# Patient Record
Sex: Female | Born: 1975 | Race: White | Hispanic: No
Health system: Southern US, Community
[De-identification: ages and names within clinical notes are randomized; demographics above are authoritative.]

## PROBLEM LIST (undated history)

## (undated) ENCOUNTER — Emergency Department (HOSPITAL_COMMUNITY): Admission: EM | Payer: Self-pay | Source: Home / Self Care

## (undated) DIAGNOSIS — T7840XA Allergy, unspecified, initial encounter: Secondary | ICD-10-CM

## (undated) DIAGNOSIS — G5600 Carpal tunnel syndrome, unspecified upper limb: Secondary | ICD-10-CM

## (undated) HISTORY — PX: APPENDECTOMY: SHX54

## (undated) HISTORY — DX: Allergy, unspecified, initial encounter: T78.40XA

---

## 1999-06-26 ENCOUNTER — Other Ambulatory Visit: Admission: RE | Admit: 1999-06-26 | Discharge: 1999-06-26 | Payer: Self-pay | Admitting: Obstetrics and Gynecology

## 2000-04-22 ENCOUNTER — Ambulatory Visit (HOSPITAL_COMMUNITY): Admission: RE | Admit: 2000-04-22 | Discharge: 2000-04-22 | Payer: Self-pay | Admitting: Family Medicine

## 2000-04-22 ENCOUNTER — Encounter: Payer: Self-pay | Admitting: Family Medicine

## 2000-06-12 ENCOUNTER — Encounter: Payer: Self-pay | Admitting: Family Medicine

## 2000-06-12 ENCOUNTER — Ambulatory Visit (HOSPITAL_COMMUNITY): Admission: RE | Admit: 2000-06-12 | Discharge: 2000-06-12 | Payer: Self-pay | Admitting: Family Medicine

## 2000-10-02 ENCOUNTER — Ambulatory Visit (HOSPITAL_COMMUNITY): Admission: RE | Admit: 2000-10-02 | Discharge: 2000-10-02 | Payer: Self-pay | Admitting: Family Medicine

## 2000-10-02 ENCOUNTER — Encounter: Payer: Self-pay | Admitting: Family Medicine

## 2000-11-08 ENCOUNTER — Inpatient Hospital Stay (HOSPITAL_COMMUNITY): Admission: AD | Admit: 2000-11-08 | Discharge: 2000-11-13 | Payer: Self-pay | Admitting: Family Medicine

## 2000-11-08 ENCOUNTER — Encounter (INDEPENDENT_AMBULATORY_CARE_PROVIDER_SITE_OTHER): Payer: Self-pay | Admitting: Specialist

## 2000-11-16 ENCOUNTER — Encounter: Admission: RE | Admit: 2000-11-16 | Discharge: 2001-02-14 | Payer: Self-pay | Admitting: Family Medicine

## 2001-01-13 ENCOUNTER — Other Ambulatory Visit: Admission: RE | Admit: 2001-01-13 | Discharge: 2001-01-13 | Payer: Self-pay | Admitting: *Deleted

## 2002-02-22 ENCOUNTER — Other Ambulatory Visit: Admission: RE | Admit: 2002-02-22 | Discharge: 2002-02-22 | Payer: Self-pay | Admitting: Obstetrics and Gynecology

## 2003-03-08 ENCOUNTER — Other Ambulatory Visit: Admission: RE | Admit: 2003-03-08 | Discharge: 2003-03-08 | Payer: Self-pay | Admitting: Obstetrics and Gynecology

## 2005-11-28 ENCOUNTER — Emergency Department (HOSPITAL_COMMUNITY): Admission: EM | Admit: 2005-11-28 | Discharge: 2005-11-28 | Payer: Self-pay | Admitting: Emergency Medicine

## 2006-02-17 ENCOUNTER — Other Ambulatory Visit: Admission: RE | Admit: 2006-02-17 | Discharge: 2006-02-17 | Payer: Self-pay | Admitting: Obstetrics and Gynecology

## 2010-05-23 ENCOUNTER — Encounter: Admission: RE | Admit: 2010-05-23 | Discharge: 2010-05-23 | Payer: Self-pay | Admitting: Obstetrics and Gynecology

## 2010-06-13 ENCOUNTER — Emergency Department (HOSPITAL_COMMUNITY): Admission: EM | Admit: 2010-06-13 | Discharge: 2010-06-13 | Payer: Self-pay | Admitting: Emergency Medicine

## 2011-04-11 NOTE — Discharge Summary (Signed)
[facility]  Patient:    Suzanne Blair, Suzanne Blair                  MRN: [id number] Adm. Date:  [id number] Disc. Date: 11/13/00 Attending:  Orpah Greek                           Discharge Summary  DATE OF BIRTH:                [birth year]  ADMITTING DIAGNOSES:          1. Post dates pregnancy 40 5/7 weeks.                               2. Prodromal labor.                               3. Borderline blood pressure with possible                                  developing preeclampsia.  DISCHARGE DIAGNOSES:          1. Post dates pregnancy at 40 6/7 weeks,                                  delivered.                               2. Primary low transverse cesarean section for                                  arrest of first stage of labor.                               3. Postpartum endometritis.                               4. Iron deficiency anemia.  PROCEDURE:                    1. Primary low transverse cesarean section.                               2. Rubella immunization.  DISCHARGE SUMMARY:            A 24 YOA G1 P0 at 40 5/7 weeks presented in prodromal labor with contractions q.2-6 minutes.  Fetal heart rate 145-155 with good long-term variability, but not technically reactive.  Cervical examination at that time was a loose 1 cm, soft, vertex, -3 with a bulging bag.  Blood pressure was noted to be elevated at 137/78 and patients DTRs were increased at 2+.  She was admitted for induction of labor with low dose Pitocin and evaluated for preeclampsia.  Preeclampsia evaluation was completely negative.  Fetal heart tracing which had been nonreactive at the time of admission remained as such.  She underwent AROM and was found to have meconium  stained fluid.  She received amnio-infusion.  Increased fetal tachycardia was noted and she was started on clindamycin within six hours of admission for possible chorioamnionitis.  She progressed slowly  over the next six hours to 4 cm dilated.  She failed to progress past that with documented adequate labor, developed an intrapartum fever, and fetal tachycardia persisted.  She underwent a primary low transverse cesarean section early on the morning of November 09, 2000 with no complications.  Was delivered of a viable female infant 8 pounds 6 ounces.  Cord pH 7.21.  Apgars 9 at one and 9 at five minutes.                                Postpartum course was complicated by iron deficiency anemia with a hemoglobin of 8.8.  Patient was asymptomatic.  About 24 hours after discontinuation of antibiotics she developed worsening left lower quadrant pain.  She was started on clindamycin and gentamycin and 48 hours after that on the fourth postpartum day had minimal fundal tenderness bilaterally.  Abdomen was soft in general and she was ambulating and eating well.  She also was stooling normally.                                Patient was breast-feeding well at the time of discharge.  DISCHARGE MEDICATIONS:        1. Clindamycin 300 mg q.8. for four days.                               2. Prenatal vitamin q.d.                               3. Ferrous sulfate 325 mg p.o. q.d.                               4. Micronor one p.o. q.d. beginning mid January                                  2002.  FOLLOW-UP:                    Patient was advised to follow up in the office in six weeks, sooner p.r.n.  She did receive rubella immunization prior to discharge. DD:  11/13/00 TD:  11/13/00 Job: [id number]

## 2011-04-11 NOTE — Op Note (Signed)
[facility]  Patient:    Suzanne Blair, Suzanne Blair                  MRN: [id number] Proc. Date: 11/09/00 Adm. Date:  [id number] Attending:  Orpah Greek Dictator:   Montey Hora, M.D. CC:         [facility]   Operative Report  PREOPERATIVE DIAGNOSES:       1. Intrauterine pregnancy at 40 weeks and 6                                  days.                               2. Arrest of first stage of labor.                               3. Intrapartum fever.                               4. Large for gestational age fetus.                               5. Meconium stained fluid.  POSTOPERATIVE DIAGNOSES:      1. Intrauterine pregnancy at 40 weeks and 6                                  days.                               2. Arrest of first stage of labor.                               3. Intrapartum fever.                               4. Large for gestational age fetus.                               5. Meconium stained fluid.  OPERATION:                    Primary low transverse cesarean section.  SURGEON:                      Zenaida Niece, M.D.  ASSISTANT:                    Montey Hora, M.D.  ANESTHESIA:                   Epidural anesthesia.  FINDINGS:                     A viable female with Apgars of 9 at one and 9 at  five minutes.  Cord pH of 7.21.  Weight of 8                               pounds 6 ounces.  ESTIMATED BLOOD LOSS:         1000 cc.  URINE:                        300 cc of clear urine.  IV FLUIDS:                    1500 cc of lactated Ringers.  COMPLICATIONS:                None.  DESCRIPTION OF PROCEDURE:     The patient was taken to the operating room where the epidural anesthesia was found to be adequate. She was prepped and draped in a normal sterile fashion in a dorsal supine position with a leftward tilt. A Pfannenstiel skin incision was made with the  scalpel and carried through to the underlying layer of fascia with the Bovie and scalpel. Fascia was nicked in the midline and the incision extended laterally with Mayo scissors.  Superior aspect of the fascial incision was grasped with Kocher clamps, elevated and the underlying rectus muscles dissected off bluntly. The inferior aspect of the incision was, in a similar fashion, grasped, tented up with Kocher clamps and the rectus muscle dissected off bluntly.  The rectus muscles were separated in the midline. The peritoneum identified, tented up and entered sharply.  With Metzenbaum scissors the peritoneal incision was extended superiorly and inferiorly with good visualization of the bladder. The bladder blade was inserted and the vesicouterine peritoneum identified, grasped with pickups and entered sharply with Metzenbaum scissors.  The incision was extended laterally and the bladder flap created digitally. Bladder blade was reinserted and the lower uterine segment was incised in transverse fashion with the scalpel. The uterine incision was extended laterally with bandage scissors. Bladder blade was removed and the infants head was delivered atraumatically.  The nose and mouth were DeLee suctioned secondary to the meconium stained fluid. The cord was clamped and cut.  The infant was handed off to the awaiting pediatricians.  Cord gas was sent. The placenta was removed manually.  The uterus exteriorized and cleared of all clots and debris.  The uterine incision was repaired with 1-0 chromic in a running locked fashion.  The uterus was returned to the abdomen.  The gutters were cleared of all clots and debris and the fascia was reapproximated with 0 Vicryl in a running fashion.  The skin was closed  with staples. The patient tolerated the procedure well.  Sponge, lap and needle counts correct x 2. There were 2 g of Cefotan given at cord clamp and the patient was taken to the recovery room in  stable condition.DD:  11/11/00 TD:  11/12/00 Job: [id number]

## 2011-09-05 ENCOUNTER — Ambulatory Visit (HOSPITAL_COMMUNITY): Payer: Managed Care, Other (non HMO) | Admitting: Anesthesiology

## 2011-09-05 ENCOUNTER — Ambulatory Visit (HOSPITAL_COMMUNITY)
Admission: RE | Admit: 2011-09-05 | Discharge: 2011-09-05 | Disposition: A | Discharge status: Home / Self Care | Payer: Managed Care, Other (non HMO) | Source: Ambulatory Visit | Attending: Obstetrics and Gynecology | Admitting: Obstetrics and Gynecology

## 2011-09-05 ENCOUNTER — Encounter (HOSPITAL_COMMUNITY): Payer: Self-pay | Admitting: Anesthesiology

## 2011-09-05 ENCOUNTER — Encounter (HOSPITAL_COMMUNITY)
Admission: RE | Disposition: A | Discharge status: Home / Self Care | Payer: Self-pay | Source: Ambulatory Visit | Attending: Obstetrics and Gynecology

## 2011-09-05 ENCOUNTER — Encounter (HOSPITAL_COMMUNITY): Payer: Self-pay | Admitting: Obstetrics and Gynecology

## 2011-09-05 ENCOUNTER — Other Ambulatory Visit: Payer: Self-pay | Admitting: Obstetrics and Gynecology

## 2011-09-05 DIAGNOSIS — O2690 Pregnancy related conditions, unspecified, unspecified trimester: Secondary | ICD-10-CM | POA: Diagnosis present

## 2011-09-05 DIAGNOSIS — G5600 Carpal tunnel syndrome, unspecified upper limb: Secondary | ICD-10-CM | POA: Insufficient documentation

## 2011-09-05 DIAGNOSIS — O021 Missed abortion: Secondary | ICD-10-CM | POA: Insufficient documentation

## 2011-09-05 HISTORY — DX: Carpal tunnel syndrome, unspecified upper limb: G56.00

## 2011-09-05 HISTORY — PX: DILATION AND EVACUATION: SHX1459

## 2011-09-05 LAB — TYPE AND SCREEN
ABO/RH(D): O POS
Antibody Screen: NEGATIVE

## 2011-09-05 LAB — CBC
MCHC: 32.8 g/dL (ref 30.0–36.0)
Platelets: 262 10*3/uL (ref 150–400)

## 2011-09-05 SURGERY — DILATION AND EVACUATION, UTERUS
Anesthesia: Monitor Anesthesia Care | Site: Vagina | Wound class: Clean Contaminated

## 2011-09-05 MED ORDER — KETOROLAC TROMETHAMINE 60 MG/2ML IM SOLN
INTRAMUSCULAR | Status: AC
  Filled 2011-09-05: qty 2

## 2011-09-05 MED ORDER — MIDAZOLAM HCL 2 MG/2ML IJ SOLN
INTRAMUSCULAR | Status: AC
  Filled 2011-09-05: qty 2

## 2011-09-05 MED ORDER — PROPOFOL 10 MG/ML IV EMUL
INTRAVENOUS | Status: DC | PRN
  Administered 2011-09-05: 120 ug/kg/min via INTRAVENOUS

## 2011-09-05 MED ORDER — KETOROLAC TROMETHAMINE 30 MG/ML IJ SOLN
INTRAMUSCULAR | Status: DC | PRN
  Administered 2011-09-05: 60 mg via INTRAVENOUS

## 2011-09-05 MED ORDER — LIDOCAINE HCL 2 % IJ SOLN
INTRAMUSCULAR | Status: DC | PRN
  Administered 2011-09-05: 16 mL

## 2011-09-05 MED ORDER — DEXAMETHASONE SODIUM PHOSPHATE 10 MG/ML IJ SOLN
INTRAMUSCULAR | Status: AC
  Filled 2011-09-05: qty 1

## 2011-09-05 MED ORDER — ESMOLOL HCL 10 MG/ML IV SOLN
INTRAVENOUS | Status: AC
  Filled 2011-09-05: qty 10

## 2011-09-05 MED ORDER — FENTANYL CITRATE 0.05 MG/ML IJ SOLN: 25.0000 ug | INTRAMUSCULAR | Status: DC | PRN

## 2011-09-05 MED ORDER — ONDANSETRON HCL 4 MG/2ML IJ SOLN
INTRAMUSCULAR | Status: AC
  Filled 2011-09-05: qty 2

## 2011-09-05 MED ORDER — FENTANYL CITRATE 0.05 MG/ML IJ SOLN
INTRAMUSCULAR | Status: DC | PRN
  Administered 2011-09-05 (×2): 50 ug via INTRAVENOUS

## 2011-09-05 MED ORDER — ONDANSETRON HCL 4 MG/2ML IJ SOLN
INTRAMUSCULAR | Status: DC | PRN
  Administered 2011-09-05: 4 mg via INTRAVENOUS

## 2011-09-05 MED ORDER — FENTANYL CITRATE 0.05 MG/ML IJ SOLN
INTRAMUSCULAR | Status: AC
  Filled 2011-09-05: qty 5

## 2011-09-05 MED ORDER — PROPOFOL 10 MG/ML IV EMUL
INTRAVENOUS | Status: AC
  Filled 2011-09-05: qty 20

## 2011-09-05 MED ORDER — LIDOCAINE HCL (CARDIAC) 20 MG/ML IV SOLN
INTRAVENOUS | Status: DC | PRN
  Administered 2011-09-05: 60 mg via INTRAVENOUS

## 2011-09-05 MED ORDER — DOXYCYCLINE HYCLATE 100 MG IV SOLR
100.0000 mg | Freq: Once | INTRAVENOUS | Status: AC
  Administered 2011-09-05: 100 mg via INTRAVENOUS
  Filled 2011-09-05: qty 100

## 2011-09-05 MED ORDER — PROPOFOL 10 MG/ML IV EMUL
INTRAVENOUS | Status: DC | PRN
  Administered 2011-09-05: 30 mg via INTRAVENOUS

## 2011-09-05 MED ORDER — LIDOCAINE HCL (CARDIAC) 20 MG/ML IV SOLN
INTRAVENOUS | Status: AC
  Filled 2011-09-05: qty 5

## 2011-09-05 MED ORDER — LACTATED RINGERS IV SOLN
INTRAVENOUS | Status: DC
  Administered 2011-09-05 (×3): via INTRAVENOUS

## 2011-09-05 MED ORDER — MIDAZOLAM HCL 5 MG/5ML IJ SOLN
INTRAMUSCULAR | Status: DC | PRN
  Administered 2011-09-05: 2 mg via INTRAVENOUS

## 2011-09-05 SURGICAL SUPPLY — 22 items
CATH ROBINSON RED A/P 16FR (CATHETERS) ×2 IMPLANT
CLOTH BEACON ORANGE TIMEOUT ST (SAFETY) ×2 IMPLANT
DECANTER SPIKE VIAL GLASS SM (MISCELLANEOUS) ×2 IMPLANT
DRAPE UTILITY XL STRL (DRAPES) ×2 IMPLANT
GLOVE BIO SURGEON STRL SZ8 (GLOVE) ×2 IMPLANT
GLOVE ORTHO TXT STRL SZ7.5 (GLOVE) ×2 IMPLANT
GOWN PREVENTION PLUS LG XLONG (DISPOSABLE) ×2 IMPLANT
GOWN STRL REIN XL XLG (GOWN DISPOSABLE) ×2 IMPLANT
KIT BERKELEY 1ST TRIMESTER 3/8 (MISCELLANEOUS) ×2 IMPLANT
NDL SPNL 22GX3.5 QUINCKE BK (NEEDLE) ×1 IMPLANT
NEEDLE SPNL 22GX3.5 QUINCKE BK (NEEDLE) ×2 IMPLANT
NS IRRIG 1000ML POUR BTL (IV SOLUTION) ×2 IMPLANT
PACK VAGINAL MINOR WOMEN LF (CUSTOM PROCEDURE TRAY) ×2 IMPLANT
PAD PREP 24X48 CUFFED NSTRL (MISCELLANEOUS) ×2 IMPLANT
SET BERKELEY SUCTION TUBING (SUCTIONS) ×2 IMPLANT
SLEEVE SURGEON STRL (DRAPES) ×2 IMPLANT
SYR CONTROL 10ML LL (SYRINGE) ×2 IMPLANT
TOWEL OR 17X24 6PK STRL BLUE (TOWEL DISPOSABLE) ×4 IMPLANT
VACURETTE 10 RIGID CVD (CANNULA) IMPLANT
VACURETTE 7MM CVD STRL WRAP (CANNULA) ×1 IMPLANT
VACURETTE 8 RIGID CVD (CANNULA) IMPLANT
VACURETTE 9 RIGID CVD (CANNULA) IMPLANT

## 2011-09-05 NOTE — H&P (Addendum)
Suzanne Blair is an 35 y.o. female, G3 P2002, HCG 2400+, ultrasound with what appears to be a molar pregnancy.  She is having some bleeding.      Menstrual History: No LMP recorded. Patient is pregnant.    Past Medical History  Diagnosis Date  . Carpal tunnel syndrome     Past Surgical History  Procedure Date  . Appendectomy   C-section X 2  History reviewed. No pertinent family history.  Social History:  does not have a smoking history on file. She does not have any smokeless tobacco history on file. Her alcohol and drug histories not on file.  Allergies:  Allergies  Allergen Reactions  . Penicillins Hives and Rash    Childhood reaction    Prescriptions prior to admission  Medication Sig Dispense Refill  . ibuprofen (ADVIL,MOTRIN) 200 MG tablet Take 400 mg by mouth daily as needed. For pain       . prenatal vitamin w/FE, FA (PRENATAL 1 + 1) 27-1 MG TABS Take 1 tablet by mouth daily.          Review of Systems  Respiratory: Negative.   Cardiovascular: Negative.     Blood pressure 102/54, pulse 88, temperature 98.4 F (36.9 C), temperature source Oral, resp. rate 18, height 5' (1.524 m), weight 52.164 kg (115 lb), SpO2 100.00%. Physical Exam  Constitutional: She appears well-developed and well-nourished.  Neck: Neck supple. No thyromegaly present.  Cardiovascular: Normal rate, regular rhythm and normal heart sounds.   No murmur heard. Respiratory: Breath sounds normal. No respiratory distress.  GI: Soft.  Genitourinary: Vagina normal.    Results for orders placed during the hospital encounter of 09/05/11 (from the past 24 hour(s))  CBC     Status: Normal   Collection Time   09/05/11  8:16 AM      Component Value Range   WBC 7.3  4.0 - 10.5 (K/uL)   RBC 4.21  3.87 - 5.11 (MIL/uL)   Hemoglobin 12.4  12.0 - 15.0 (g/dL)   HCT 78.2  95.6 - 21.3 (%)   MCV 89.8  78.0 - 100.0 (fL)   MCH 29.5  26.0 - 34.0 (pg)   MCHC 32.8  30.0 - 36.0 (g/dL)   RDW 08.6  57.8  - 46.9 (%)   Platelets 262  150 - 400 (K/uL)    No results found.  Assessment/Plan: Possible molar pregnancy.  The situation has been discussed with the pt, options discussed.  D&E procedure and risks discussed, will proceed with D&E, will follow HCGs after procedure.  Suzzane Quilter D 09/05/2011, 8:59 AM

## 2011-09-05 NOTE — Op Note (Signed)
Preoperative Diagnosis:  Probable molar pregnancy Postop Diagnosis:  Possible molar pregnancy Procedure:  D&E Anesthesia:  MAC, paracervical block Findings: Cervix was closed, uterus was normal size, abundant products of conception were obtained Specimens: Products of conception sent for routine pathology Estimated blood loss: Minimal Complications: None  Procedure in detail: The patient was taken to the operating room and placed in the dorsosupine position. IV sedation was given and she was placed and mobile stirrups. Perineum and vagina were prepped and draped in the usual sterile fashion, bladder drained with a red Robinson catheter. A Graves speculum was inserted in the vagina. The anterior lip of the cervix was grasped with a single-tooth tenaculum. Paracervical block was then performed with a total of 16 cc of 2% plain lidocaine. Uterus then sounded to 9 cm. Cervix was gradually easily dilated to size 25 dilator. A size 7 curved suction curet was then inserted without difficulty. Suction curettage was return was performed with return of abundant products of conception. Sharp curettage was performed with which revealed good uterine cry in all quadrants and no significant tissue. Suction curettage was performed one more time which revealed minimal blood. The single-tooth tenaculum was removed from the cervix and bleeding was controlled with pressure. All instruments were removed from the vagina. The patient was taken to the recovery in stable condition after tolerating the procedure well. Counts were correct, she received doxycycline at the beginning of the procedure and had PAS hose on throughout the procedure.

## 2011-09-05 NOTE — Anesthesia Postprocedure Evaluation (Signed)
Anesthesia Post Note  Patient: Suzanne Blair  Procedure(s) Performed:  DILATATION AND EVACUATION (D&E)  Anesthesia type: MAC  Patient location: PACU  Post pain: Pain level controlled  Post assessment: Post-op Vital signs reviewed  Last Vitals:  Filed Vitals:   09/05/11 1045  BP: 102/64  Pulse: 73  Temp: 98.3 F (36.8 C)  Resp: 19    Post vital signs: Reviewed  Level of consciousness: sedated  Complications: No apparent anesthesia complications

## 2011-09-05 NOTE — Anesthesia Preprocedure Evaluation (Addendum)
Anesthesia Evaluation  Name, MR# and DOB Patient awake  General Assessment Comment  Reviewed: Allergy & Precautions, H&P , NPO status , Patient's Chart, lab work & pertinent test results, reviewed documented beta blocker date and time   History of Anesthesia Complications Negative for: history of anesthetic complications  Airway Mallampati: I TM Distance: >3 FB Neck ROM: full    Dental  (+) Teeth Intact   Pulmonary  clear to auscultation        Cardiovascular regular Normal    Neuro/Psych Negative Neurological ROS  Negative Psych ROS   GI/Hepatic negative GI ROS Neg liver ROS    Endo/Other  Negative Endocrine ROS  Renal/GU negative Renal ROS     Musculoskeletal   Abdominal   Peds  Hematology negative hematology ROS (+)   Anesthesia Other Findings   Reproductive/Obstetrics (+) Pregnancy (molar pregnancy, 10 weeks)                           Anesthesia Physical Anesthesia Plan  ASA: II  Anesthesia Plan: MAC   Post-op Pain Management:    Induction:   Airway Management Planned:   Additional Equipment:   Intra-op Plan:   Post-operative Plan:   Informed Consent: I have reviewed the patients History and Physical, chart, labs and discussed the procedure including the risks, benefits and alternatives for the proposed anesthesia with the patient or authorized representative who has indicated his/her understanding and acceptance.   Dental Advisory Given  Plan Discussed with: CRNA and Surgeon  Anesthesia Plan Comments:         Anesthesia Quick Evaluation

## 2011-09-05 NOTE — Preoperative (Signed)
Beta Blockers   Reason not to administer Beta Blockers:Not Applicable 

## 2011-09-05 NOTE — Transfer of Care (Signed)
Immediate Anesthesia Transfer of Care Note  Patient: Suzanne Blair  Procedure(s) Performed:  DILATATION AND EVACUATION (D&E)  Patient Location: PACU  Anesthesia Type: MAC  Level of Consciousness: awake, oriented and patient cooperative  Airway & Oxygen Therapy: Patient Spontanous Breathing and Patient connected to nasal cannula oxygen  Post-op Assessment: Report given to PACU RN and Post -op Vital signs reviewed and stable  Post vital signs: Reviewed and stable  Complications: No apparent anesthesia complications

## 2011-09-05 NOTE — Progress Notes (Signed)
   History reviewed, patient examined, no change in status, stable for surgery.  

## 2011-09-07 ENCOUNTER — Encounter (HOSPITAL_COMMUNITY): Payer: Self-pay | Admitting: Obstetrics and Gynecology

## 2014-03-01 ENCOUNTER — Other Ambulatory Visit: Payer: Self-pay | Admitting: Otolaryngology

## 2014-03-01 DIAGNOSIS — K1120 Sialoadenitis, unspecified: Secondary | ICD-10-CM

## 2014-03-03 ENCOUNTER — Ambulatory Visit
Admission: RE | Admit: 2014-03-03 | Discharge: 2014-03-03 | Disposition: A | Discharge status: Home / Self Care | Payer: BC Managed Care – PPO | Source: Ambulatory Visit | Attending: Otolaryngology | Admitting: Otolaryngology

## 2014-03-03 DIAGNOSIS — K1120 Sialoadenitis, unspecified: Secondary | ICD-10-CM

## 2014-03-03 MED ORDER — IOHEXOL 300 MG/ML  SOLN
75.0000 mL | Freq: Once | INTRAMUSCULAR | Status: AC | PRN
  Administered 2014-03-03: 75 mL via INTRAVENOUS

## 2014-03-08 ENCOUNTER — Other Ambulatory Visit (HOSPITAL_COMMUNITY): Payer: Self-pay | Admitting: Otolaryngology

## 2014-03-08 DIAGNOSIS — K118 Other diseases of salivary glands: Secondary | ICD-10-CM

## 2014-03-09 ENCOUNTER — Other Ambulatory Visit: Payer: Self-pay | Admitting: Radiology

## 2014-03-13 ENCOUNTER — Other Ambulatory Visit: Payer: Self-pay | Admitting: Radiology

## 2014-03-14 ENCOUNTER — Other Ambulatory Visit: Payer: Self-pay | Admitting: Radiology

## 2014-03-15 ENCOUNTER — Ambulatory Visit (HOSPITAL_COMMUNITY): Payer: BC Managed Care – PPO

## 2014-03-15 ENCOUNTER — Ambulatory Visit (HOSPITAL_COMMUNITY)
Admission: RE | Admit: 2014-03-15 | Discharge: 2014-03-15 | Disposition: A | Discharge status: Home / Self Care | Payer: BC Managed Care – PPO | Source: Ambulatory Visit | Attending: Otolaryngology | Admitting: Otolaryngology

## 2014-03-15 ENCOUNTER — Encounter (HOSPITAL_COMMUNITY): Payer: Self-pay

## 2014-03-15 DIAGNOSIS — K118 Other diseases of salivary glands: Secondary | ICD-10-CM

## 2014-03-15 DIAGNOSIS — K1120 Sialoadenitis, unspecified: Secondary | ICD-10-CM | POA: Insufficient documentation

## 2014-03-15 LAB — CBC
HCT: 38.7 % (ref 36.0–46.0)
Hemoglobin: 12.9 g/dL (ref 12.0–15.0)
MCH: 30.1 pg (ref 26.0–34.0)
MCHC: 33.3 g/dL (ref 30.0–36.0)
MCV: 90.4 fL (ref 78.0–100.0)
Platelets: 271 10*3/uL (ref 150–400)
RBC: 4.28 MIL/uL (ref 3.87–5.11)
RDW: 12.6 % (ref 11.5–15.5)
WBC: 5.1 10*3/uL (ref 4.0–10.5)

## 2014-03-15 LAB — PROTIME-INR
INR: 0.99 (ref 0.00–1.49)
PROTHROMBIN TIME: 12.9 s (ref 11.6–15.2)

## 2014-03-15 LAB — APTT: APTT: 30 s (ref 24–37)

## 2014-03-15 LAB — HCG, SERUM, QUALITATIVE: PREG SERUM: NEGATIVE

## 2014-03-15 MED ORDER — MIDAZOLAM HCL 2 MG/2ML IJ SOLN
INTRAMUSCULAR | Status: AC | PRN
  Administered 2014-03-15 (×2): 1 mg via INTRAVENOUS

## 2014-03-15 MED ORDER — MIDAZOLAM HCL 2 MG/2ML IJ SOLN
INTRAMUSCULAR | Status: AC
  Filled 2014-03-15: qty 4

## 2014-03-15 MED ORDER — FENTANYL CITRATE 0.05 MG/ML IJ SOLN
INTRAMUSCULAR | Status: AC
  Filled 2014-03-15: qty 4

## 2014-03-15 MED ORDER — SODIUM CHLORIDE 0.9 % IV SOLN: INTRAVENOUS | Status: DC

## 2014-03-15 MED ORDER — FENTANYL CITRATE 0.05 MG/ML IJ SOLN
INTRAMUSCULAR | Status: AC | PRN
  Administered 2014-03-15: 25 ug via INTRAVENOUS

## 2014-03-15 NOTE — H&P (Signed)
Suzanne Blair is an 38 y.o. female.   Chief Complaint: Pt has had symptoms of Left salivary gland swelling off and on x 2 yrs Has always been able to press on gland and release saliva  Noted 3 months ago new "mass" separate from salivary gland Mass has not changed in size; NT CT 03/03/14 revealed abnormal parotid gland  MD has requested biopsy of same  HPI: nonsmoker  Past Medical History  Diagnosis Date  . Carpal tunnel syndrome     Past Surgical History  Procedure Laterality Date  . Appendectomy    . Cesarean section  x2  . Dilation and evacuation  09/05/2011    Procedure: DILATATION AND EVACUATION (D&E);  Surgeon: Clarene Duke, MD;  Location: [facility];  Service: Gynecology;  Laterality: N/A;    No family history on file. Social History:  reports that she has never smoked. She does not have any smokeless tobacco history on file. She reports that she does not drink alcohol or use illicit drugs.  Allergies:  Allergies  Allergen Reactions  . Penicillins Hives and Rash    Childhood reaction     (Not in a hospital admission)  No results found for this or any previous visit (from the past 48 hour(s)). No results found.  Review of Systems  Constitutional: Negative for fever and weight loss.  Respiratory: Negative for shortness of breath.   Cardiovascular: Negative for chest pain.  Gastrointestinal: Negative for nausea, vomiting and abdominal pain.  Musculoskeletal: Negative for neck pain.  Neurological: Negative for weakness and headaches.  Psychiatric/Behavioral: Negative for substance abuse.    Blood pressure 99/60, pulse 94, temperature 98.5 F (36.9 C), resp. rate 20, height 5' (1.524 m), weight 50.803 kg (112 lb), last menstrual period 02/24/2014, SpO2 100.00%, unknown if currently breastfeeding. Physical Exam  Constitutional: She is oriented to person, place, and time. She appears well-developed and well-nourished.  Neck:  Palpable mass left parotid; NT   Cardiovascular: Normal rate, regular rhythm and normal heart sounds.   No murmur heard. Respiratory: Effort normal and breath sounds normal. She has no wheezes.  GI: Soft. Bowel sounds are normal. There is no tenderness.  Musculoskeletal: Normal range of motion.  Neurological: She is alert and oriented to person, place, and time.  Skin: Skin is warm and dry.  Psychiatric: She has a normal mood and affect. Her behavior is normal. Judgment and thought content normal.     Assessment/Plan Left parotid gland mass x 3 mo Abnormal CT Scheduled for biopsy Pt aware of procedure benefits and risks and agreeable to proceed Consent signed and in chart  Lavonia Drafts 03/15/2014, 9:34 AM

## 2014-03-15 NOTE — Discharge Instructions (Signed)
Bone Biopsy, Needle, Care After Read the instructions outlined below and refer to this sheet in the next few weeks. These discharge instructions provide you with general information on caring for yourself after you leave the hospital. Your caregiver may also give you specific instructions. While your treatment has been planned according to the most current medical practices available, unavoidable complications sometimes occur. If you have any problems or questions after discharge, call your caregiver. Finding out the results of your test Not all test results are available during your visit. If your test results are not back during the visit, make an appointment with your caregiver to find out the results. Do not assume everything is normal if you have not heard from your caregiver or the medical facility. It is important for you to follow up on all of your test results.  SEEK MEDICAL CARE IF:   You have redness, swelling, or increasing pain at the site of the biopsy.  You have pus coming from the biopsy site.  You have drainage from the biopsy site lasting longer than 1 day.  You notice a bad smell coming from the biopsy site or dressing.  You develop persistent nausea or vomiting. SEEK IMMEDIATE MEDICAL CARE IF:  You have a fever.  You develop a rash.  You have difficulty breathing.  You develop any reaction or side effects to medicines given. Document Released: 05/30/2005 Document Revised: 08/31/2013 Document Reviewed: 04/17/2009 [facility] Patient Information 2014 [place], [place].

## 2014-03-15 NOTE — Procedures (Signed)
Technically successful US guided FNA of L parotid gland nodule. Samples sent to lab. No immediate post procedural complications.

## 2014-03-21 ENCOUNTER — Other Ambulatory Visit: Payer: Self-pay | Admitting: Otolaryngology

## 2014-03-21 DIAGNOSIS — K118 Other diseases of salivary glands: Secondary | ICD-10-CM

## 2014-03-27 ENCOUNTER — Ambulatory Visit
Admission: RE | Admit: 2014-03-27 | Discharge: 2014-03-27 | Disposition: A | Discharge status: Home / Self Care | Payer: BC Managed Care – PPO | Source: Ambulatory Visit | Attending: Otolaryngology | Admitting: Otolaryngology

## 2014-03-27 DIAGNOSIS — K118 Other diseases of salivary glands: Secondary | ICD-10-CM

## 2014-03-27 MED ORDER — GADOBENATE DIMEGLUMINE 529 MG/ML IV SOLN
10.0000 mL | Freq: Once | INTRAVENOUS | Status: AC | PRN
  Administered 2014-03-27: 10 mL via INTRAVENOUS

## 2014-09-01 ENCOUNTER — Encounter: Payer: Self-pay | Admitting: *Deleted

## 2014-09-25 ENCOUNTER — Encounter: Payer: Self-pay | Admitting: *Deleted

## 2014-09-27 ENCOUNTER — Other Ambulatory Visit: Payer: Self-pay | Admitting: Otolaryngology

## 2014-09-27 DIAGNOSIS — K118 Other diseases of salivary glands: Secondary | ICD-10-CM

## 2014-10-12 ENCOUNTER — Other Ambulatory Visit: Payer: Self-pay | Admitting: Otolaryngology

## 2014-10-12 ENCOUNTER — Ambulatory Visit
Admission: RE | Admit: 2014-10-12 | Discharge: 2014-10-12 | Disposition: A | Discharge status: Home / Self Care | Payer: BC Managed Care – PPO | Source: Ambulatory Visit | Attending: Family Medicine | Admitting: Family Medicine

## 2014-10-12 ENCOUNTER — Other Ambulatory Visit: Payer: Self-pay | Admitting: Family Medicine

## 2014-10-12 ENCOUNTER — Inpatient Hospital Stay: Admission: RE | Admit: 2014-10-12 | Payer: Self-pay | Source: Ambulatory Visit

## 2014-10-12 DIAGNOSIS — K118 Other diseases of salivary glands: Secondary | ICD-10-CM

## 2015-01-11 IMAGING — CT CT NECK W/ CM
4 of 6 series · 14 of 33 positions shown, 16 images · IV contrast (75CC OMNI 300)
Comparison: Head CT without contrast 11/28/2005.

CLINICAL DATA: 37-year-old female with left parotid swelling.
Possible sialoadenitis. Symptoms for several years. Initial
encounter.

EXAM:
CT NECK WITH CONTRAST
TECHNIQUE: Multidetector CT imaging of the neck was performed using the
standard protocol following the bolus administration of intravenous
contrast.
CONTRAST:  75mL OMNIPAQUE IOHEXOL 300 MG/ML  SOLN

[Series 3: axial neck · axial · 0.45mm/px · z∈[-6,+64]mm · 2 of 86 slices shown]
[im 29/86  bone]
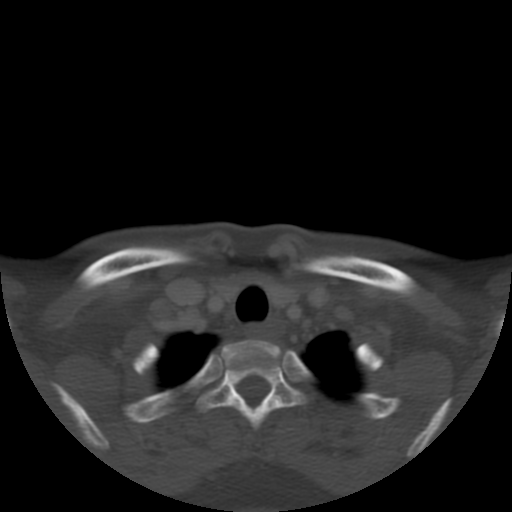
[im 57/86  bone]
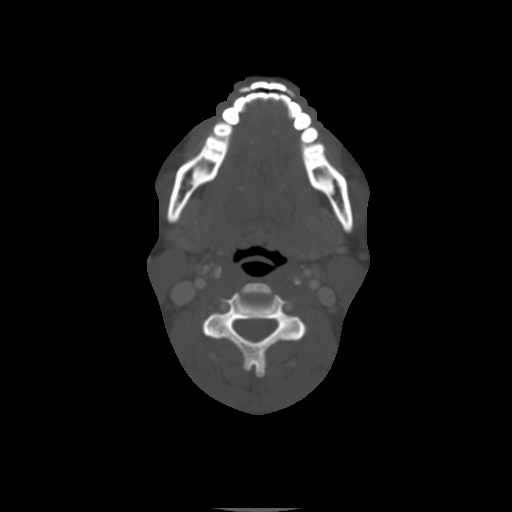

[Series 200: cor · coronal · 0.45mm/px · 3 of 96 slices shown]
[im 23/96  bone]
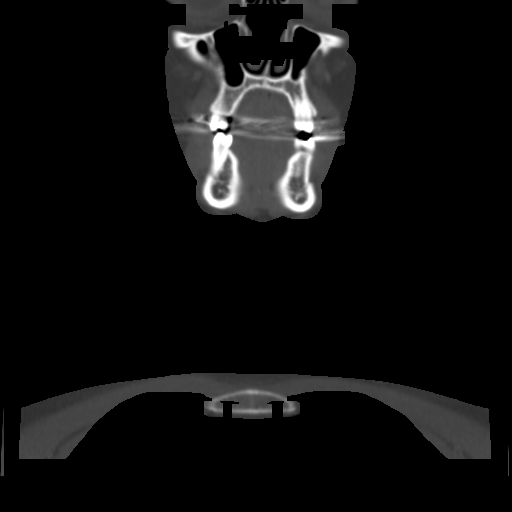
[im 40/96  bone]
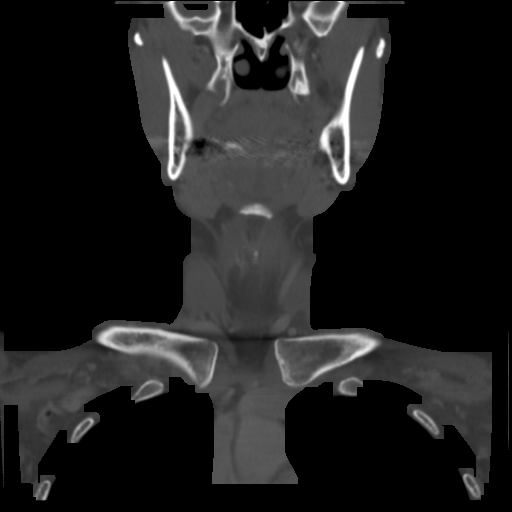
[im 56/96  bone]
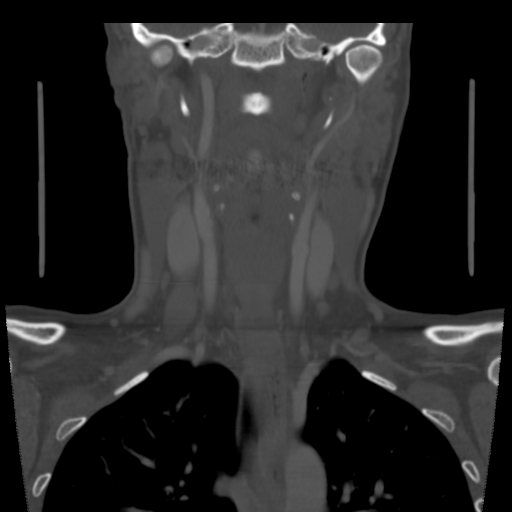

[Series 201: axial · axial · 0.45mm/px · z∈[-79,+60]mm · 4 of 124 slices shown, 5 images]
[im 25/124  soft-tissue]
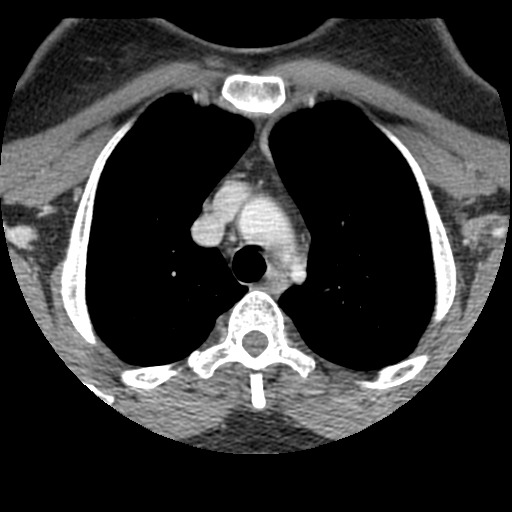
[im 25/124  bone]
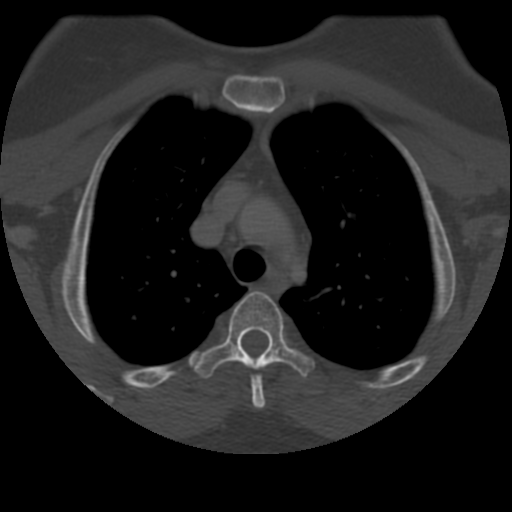
[im 50/124  bone]
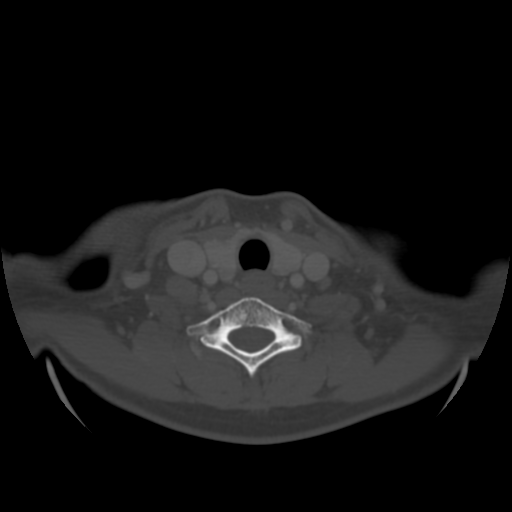
[im 74/124  bone]
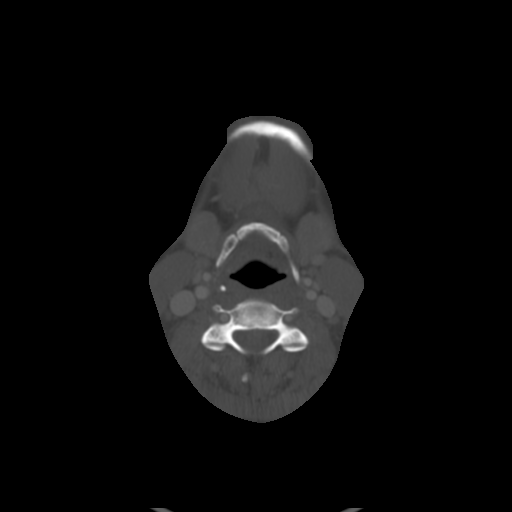
[im 99/124  bone]
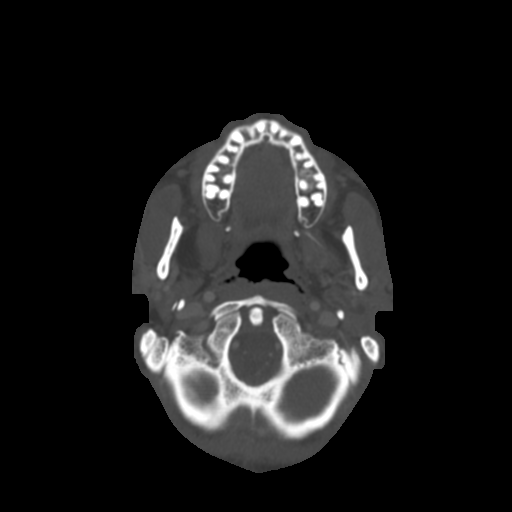

[Series 203: sag · sagittal · 0.45mm/px · 5 of 71 slices shown, 6 images]
[im 24/71  bone]
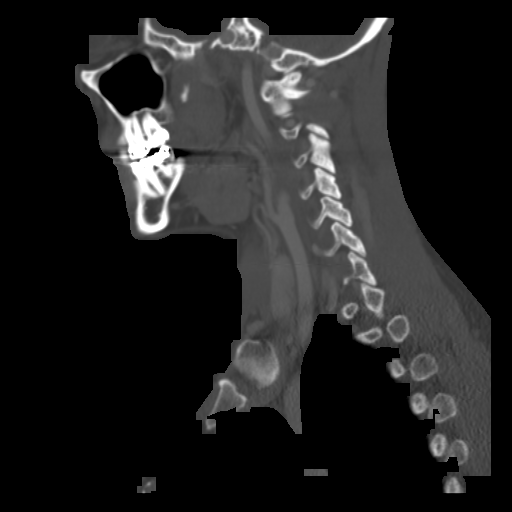
[im 30/71  bone]
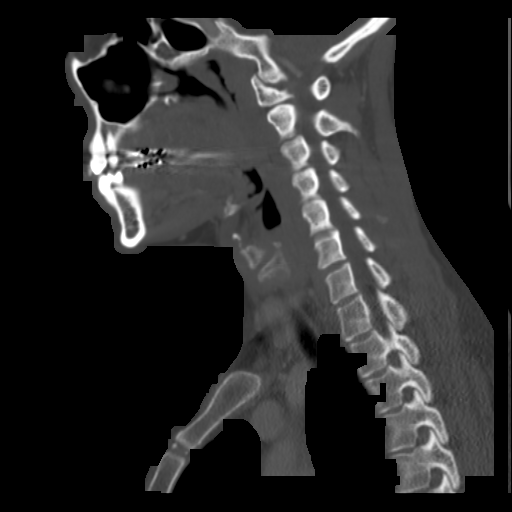
[im 36/71  soft-tissue]
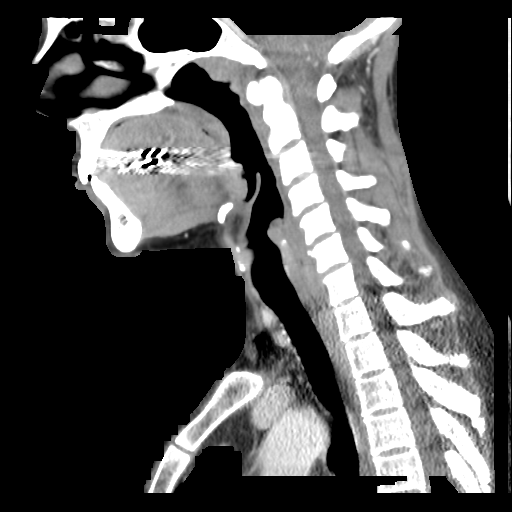
[im 36/71  bone]
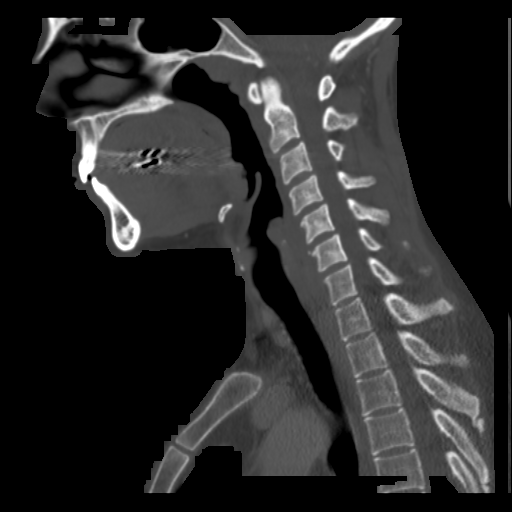
[im 41/71  bone]
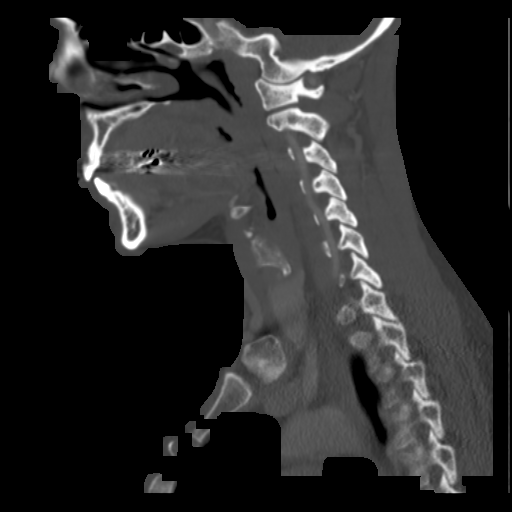
[im 47/71  bone]
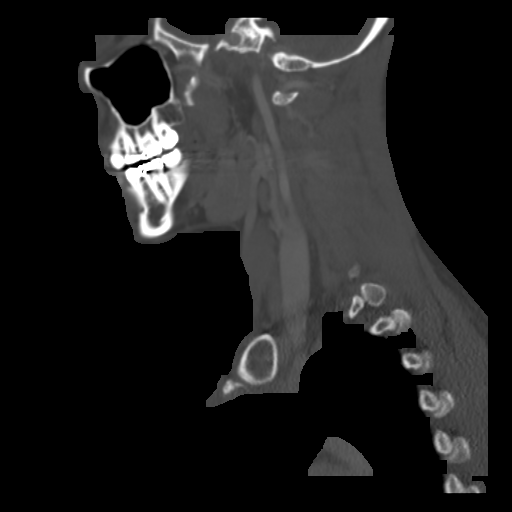

[14 of 33 positions shown; findings below may reference images not displayed]

FINDINGS: Negative lung apices. No superior mediastinal lymphadenopathy.
Negative visible osseous structures in the thorax.

Negative thyroid, pharynx, parapharyngeal spaces, retropharyngeal
space and sublingual space. Mild asymmetry of the larynx, within
normal limits. Negative submandibular glands. Major vascular
structures in the neck and at the skullbase are patent. Negative
visible brain parenchyma and orbits soft tissues.

Visualized paranasal sinuses and mastoids are clear. No acute
osseous abnormality identified.

Palpable area of concern marked at the left parotid space on series
3, image 17. The left parotid gland is heterogeneous, with scattered
hypodense nodules, and asymmetric enlargement of both the
superficial and deep lobes when compared to the smaller and more
homogeneous right parotid. See series 3, image 14 which demonstrates
a lobulated appearance of the parotid which trans axially
encompasses 33 x 35 mm. There is a small 2-3 mm calcification of the
gland in the deep lobe near the parapharyngeal space (same image).
No other parenchymal calcifications identified. The left parotid
duct is nondilated.

There is significant asymmetric soft tissue at the left stylomastoid
foramen (series 3, image 9, compare to the opposite side on the same
image). No skullbase erosion.

No cervical lymphadenopathy. Level 2 nodes measure up to 8 mm short
axis bilaterally.
IMPRESSION: 1. Lobulated and heterogeneous left parotid gland. Superficial and
deep lobe involvement. Asymmetric soft tissue at the left
stylomastoid foramen. Appearance is suspicious for a primary parotid
neoplasm, and furthermore there is evidence of abnormal soft tissue
along the course of the left seventh nerve at the left stylomastoid
foramen. Query left facial weakness.
2. Followup MRI (either face MRI without and with contrast or IAC
protocol Brain MRI without and with contrast) would further
characterize the left parotid gland an the left seventh nerve.
3. No lymphadenopathy.  Other salivary glands appear normal.
Salient findings discussed by telephone with Nurse Lesya Aujla for

## 2015-01-23 IMAGING — US US BIOPSY
1 series · 13 of 15 positions shown · non-contrast
Comparison: Neck CT - 03/03/2014

MEDICATIONS:
None

COMPLICATIONS:
None immediate

INDICATION: History of left-sided parotid gland swelling. Abnormal appearance of
the left parotid gland on recently performed neck CT. Request made
to perform ultrasound-guided fine-needle aspiration of the hypo
attenuating areas within the left parotid gland as was discussed
with the ordering physician.

EXAM:
ULTRASOUND GUIDED FINE NEEDLE ASPIRATION OF INDETERMINATE LEFT
PAROTID GLAND LESION
TECHNIQUE: Informed written consent was obtained from the patient after a
discussion of the risks, benefits and alternatives to treatment.
Questions regarding the procedure were encouraged and answered. A
timeout was performed prior to the initiation of the procedure.

[Series 1: us biopsy · 0.04mm/px · 15 acquisitions, 13 frames shown]
[im 1/15]
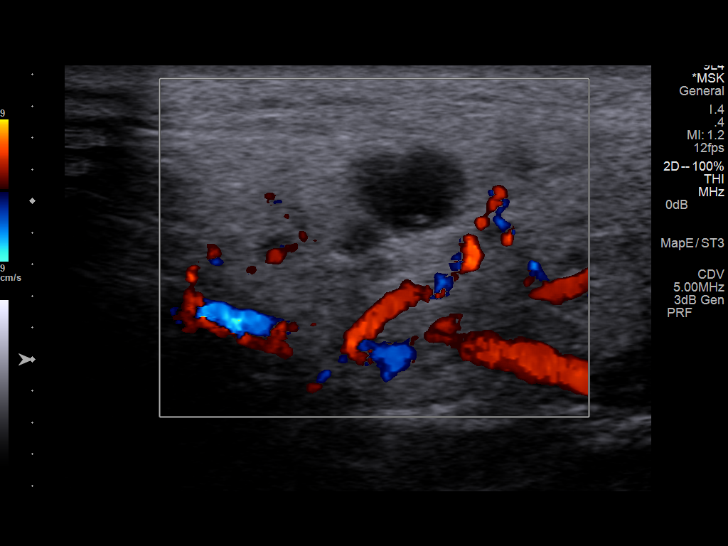
[im 2/15]
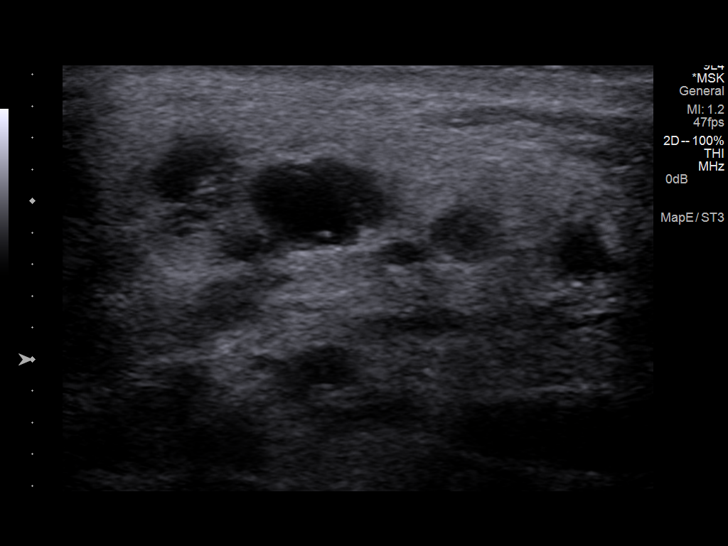
[im 3/15]
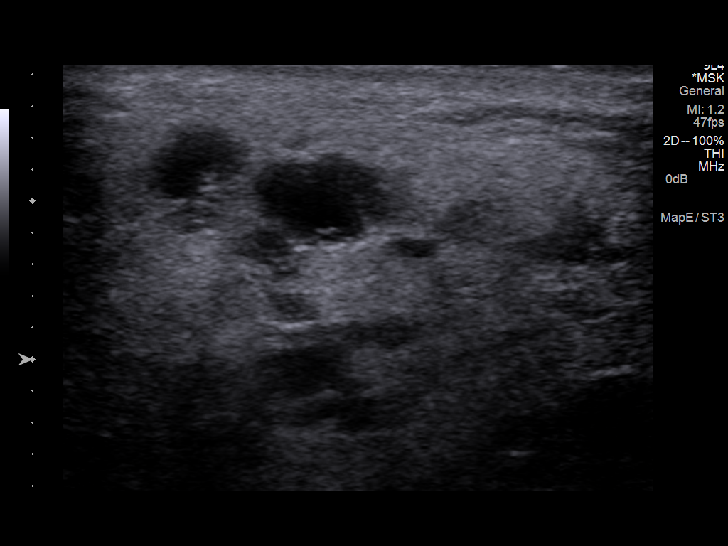
[im 5/15]
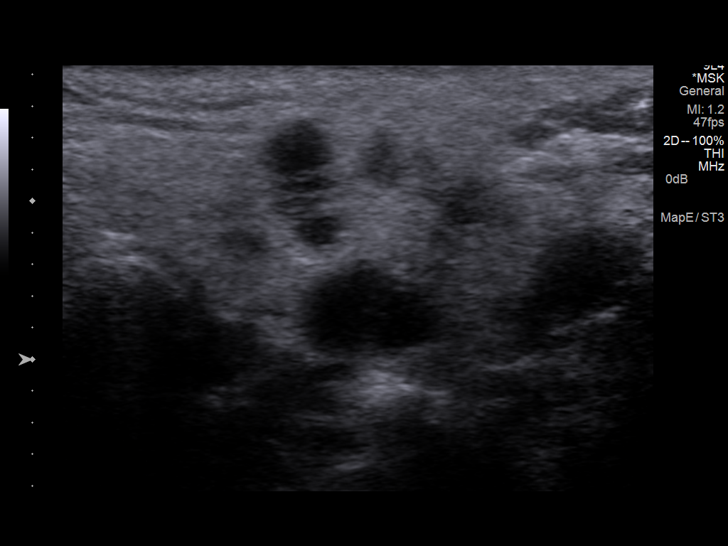
[im 6/15]
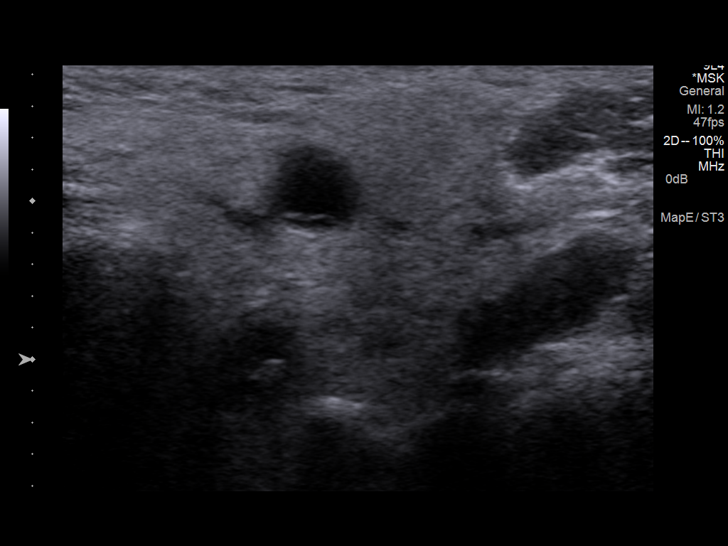
[im 7/15]
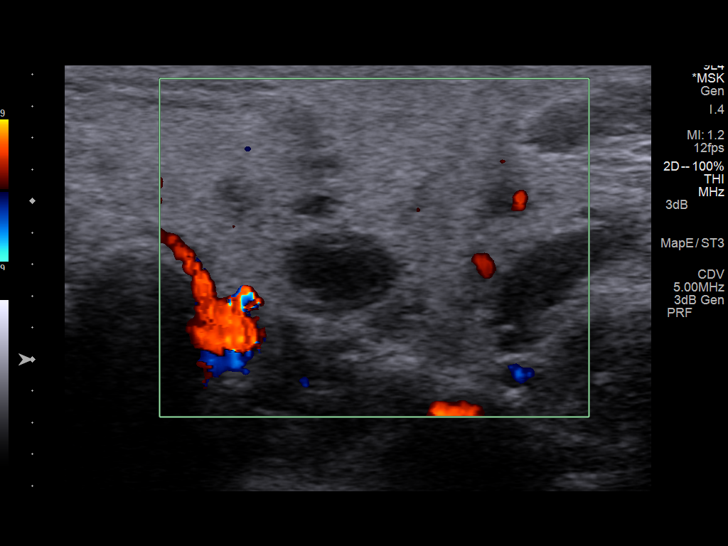
[im 8/15]
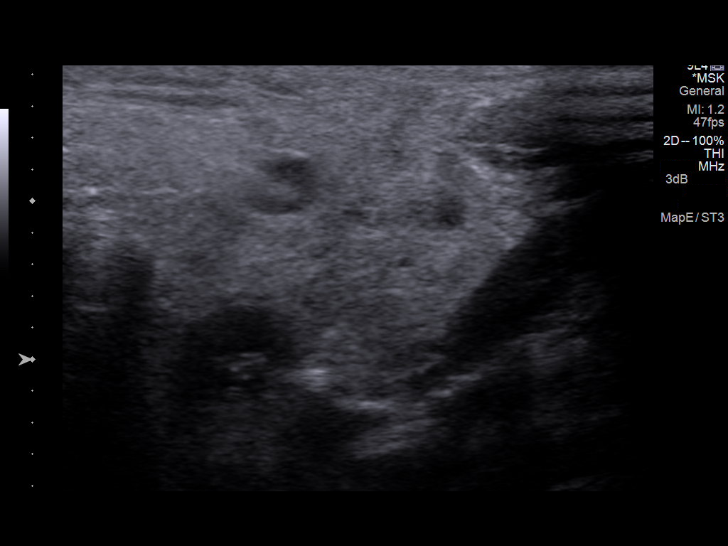
[im 9/15]
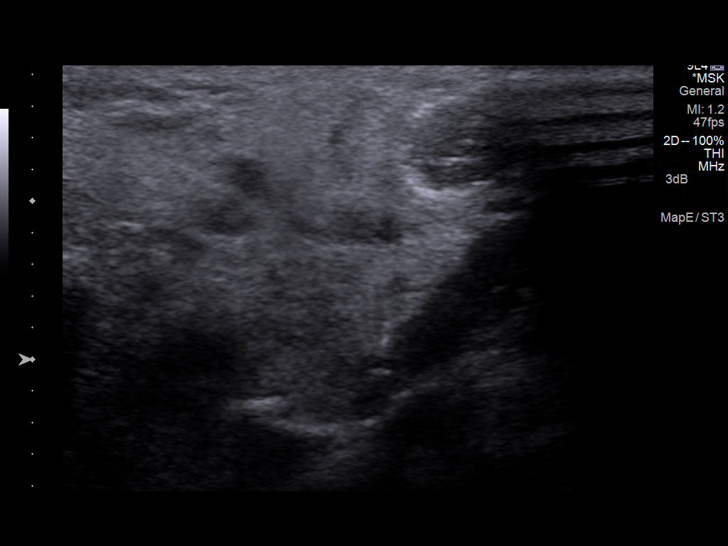
[im 10/15]
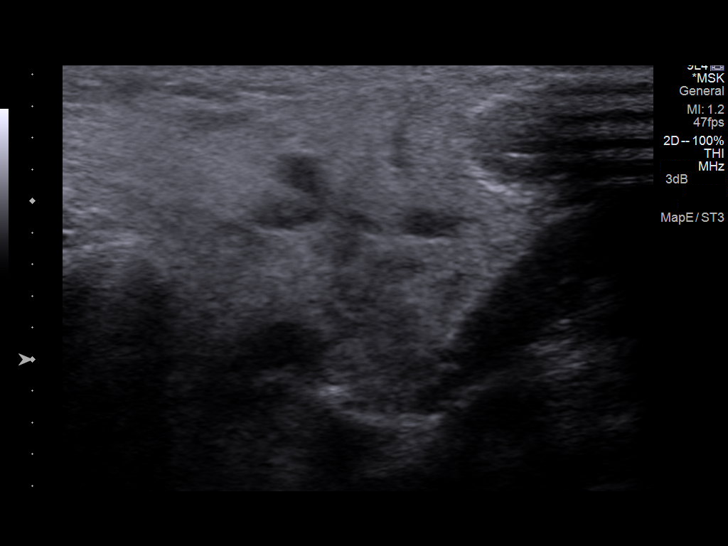
[im 11/15]
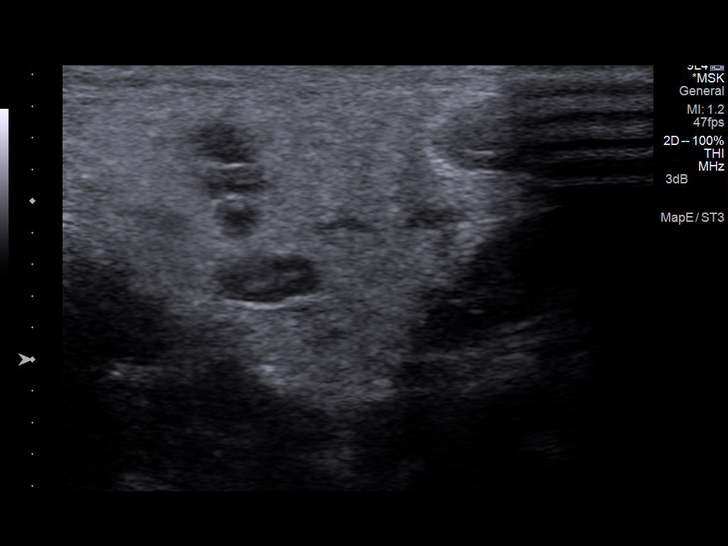
[im 13/15]
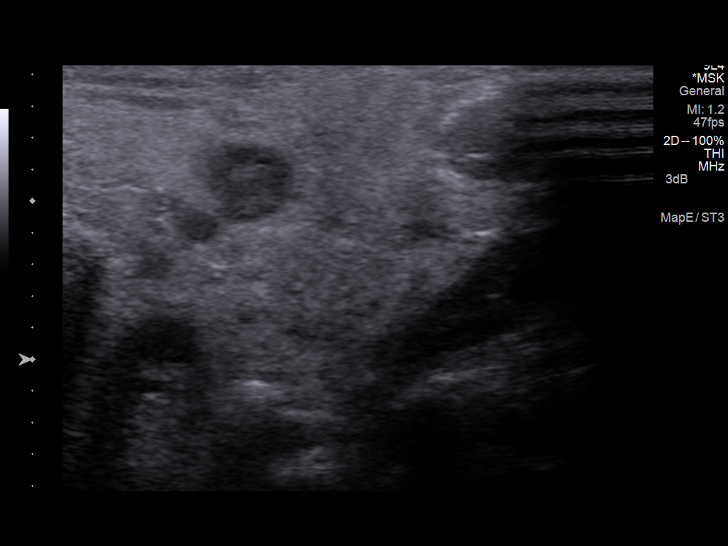
[im 14/15]
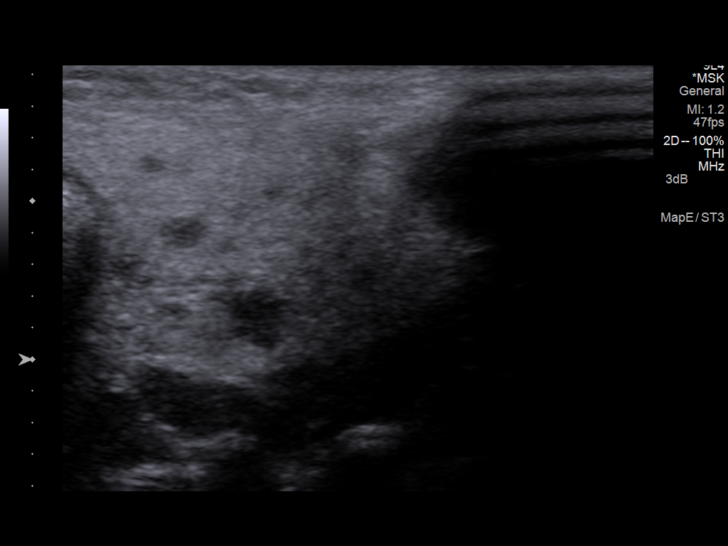
[im 15/15]
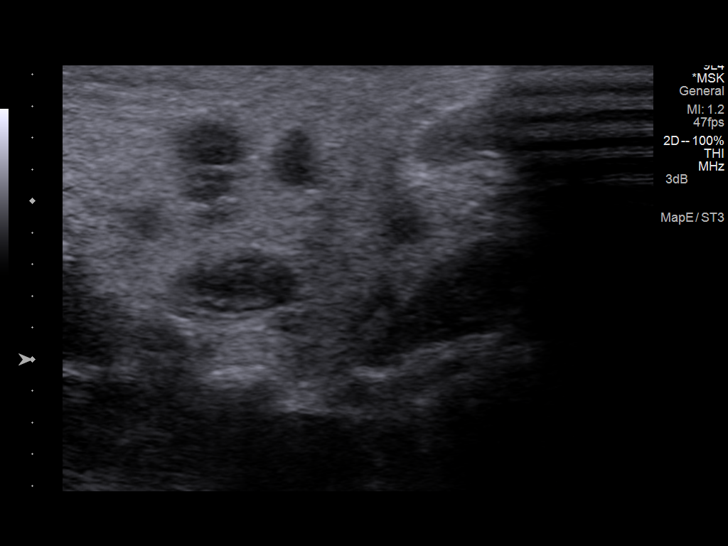

[13 of 15 positions shown; findings below may reference images not displayed]

Pre-procedural ultrasound scanning demonstrated mild heterogeneity
of the left parotid gland with multiple circular and serpiginous
hypoechoic structures correlating with the ill-defined areas of
hypoattenuation on the preceding contrast-enhanced neck CT.

The procedure was planned. The neck was prepped in the usual sterile
fashion, and a sterile drape was applied covering the operative
field. A timeout was performed prior to the initiation of the
procedure. Local anesthesia was provided with 1% lidocaine.

Under direct ultrasound guidance, 2 FNA biopsies were performed of a
dominant oval-shaped hypoechoic nodule within the more superficial
aspect of the left parotid gland (FNA biopsies #1 and #2), likely
correlating with the nodule seen on preceding neck CT image 17,
series 3. Under direct ultrasound guidance, 2 FNA biopsies were
performed of a dominant serpiginous hypoechoic nodule within the
deeper aspect of the left parotid gland (FNA biopsies #3 and #4),
likely correlating with the area seen on preceding neck CT image 14,
series 3. The samples were prepared and submitted to pathology.

Limited post procedural scanning was negative for hematoma or
additional complication. Dressings were placed. The patient
tolerated the above procedures procedure well without immediate
postprocedural complication.
IMPRESSION: Technically successful ultrasound guided fine needle aspiration of
dominant oval-shaped hypoechoic nodule within the superficial aspect
of the left lobe of the parotid gland as well as the dominant
serpiginous hypoechoic nodule within the deeper aspect of the left
parotid gland. If biopsy proves nondiagnostic, further evaluation
with face or IAC MRI is recommended.

## 2015-08-13 ENCOUNTER — Other Ambulatory Visit: Payer: Self-pay | Admitting: Otolaryngology

## 2015-08-13 DIAGNOSIS — K118 Other diseases of salivary glands: Secondary | ICD-10-CM

## 2015-08-13 DIAGNOSIS — K1120 Sialoadenitis, unspecified: Secondary | ICD-10-CM

## 2015-08-14 ENCOUNTER — Ambulatory Visit
Admission: RE | Admit: 2015-08-14 | Discharge: 2015-08-14 | Disposition: A | Discharge status: Home / Self Care | Payer: BLUE CROSS/BLUE SHIELD | Source: Ambulatory Visit | Attending: Otolaryngology | Admitting: Otolaryngology

## 2015-08-14 DIAGNOSIS — K1120 Sialoadenitis, unspecified: Secondary | ICD-10-CM

## 2015-08-14 DIAGNOSIS — K118 Other diseases of salivary glands: Secondary | ICD-10-CM

## 2015-08-16 ENCOUNTER — Other Ambulatory Visit: Payer: Self-pay

## 2016-06-23 IMAGING — US US SOFT TISSUE HEAD/NECK
1 series · 14 of 25 positions shown · non-contrast
Comparison: None.

ADDENDUM:
Compared to the prior study, the largest cyst on today's study is 9
mm. On the prior study, the largest measured 7 mm. The dominant cyst
has therefore enlarged. This is of unknown significance.
CLINICAL DATA: Left parotid swelling

EXAM:
ULTRASOUND OF HEAD/NECK SOFT TISSUES
TECHNIQUE: Ultrasound examination of the head and neck soft tissues was
performed in the area of clinical concern.

[Series 1: us soft tissue head/neck · 0.08mm/px · 14 of 31 slices shown]
[im 1/31]
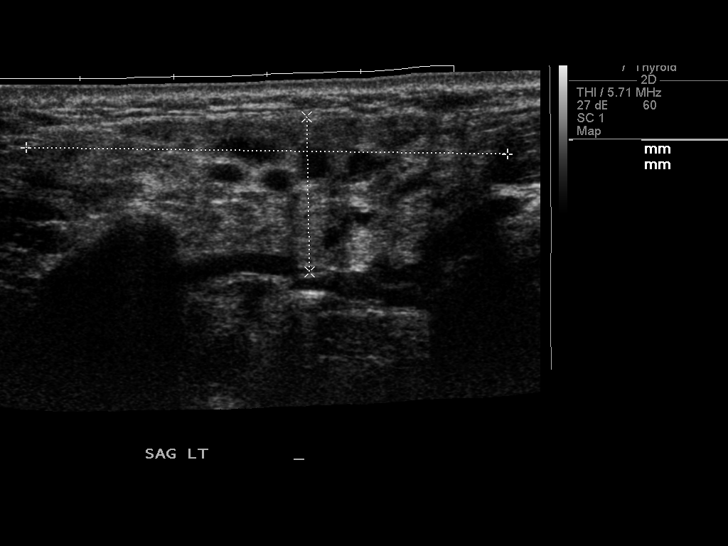
[im 3/31]
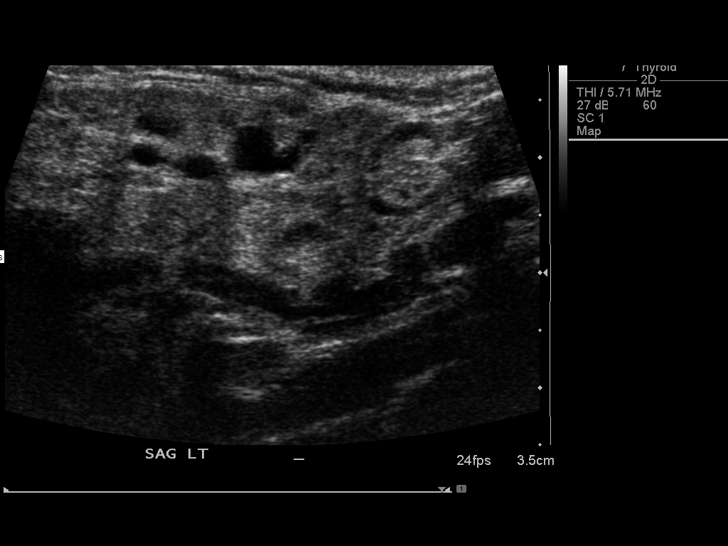
[im 6/31]
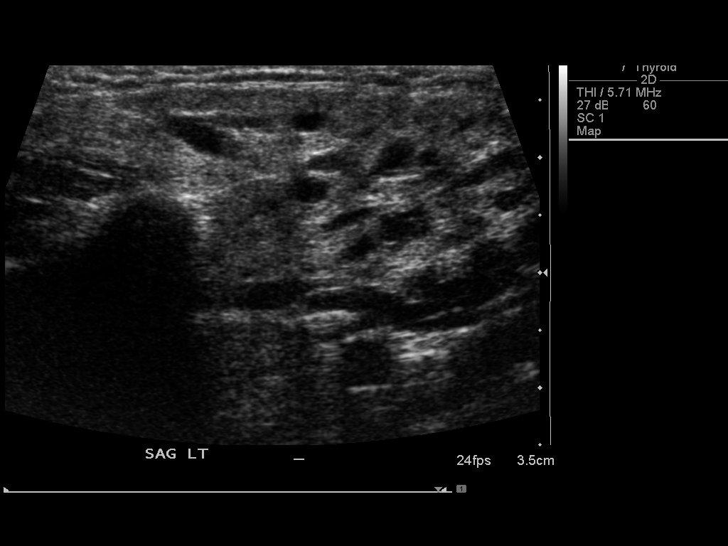
[im 8/31]
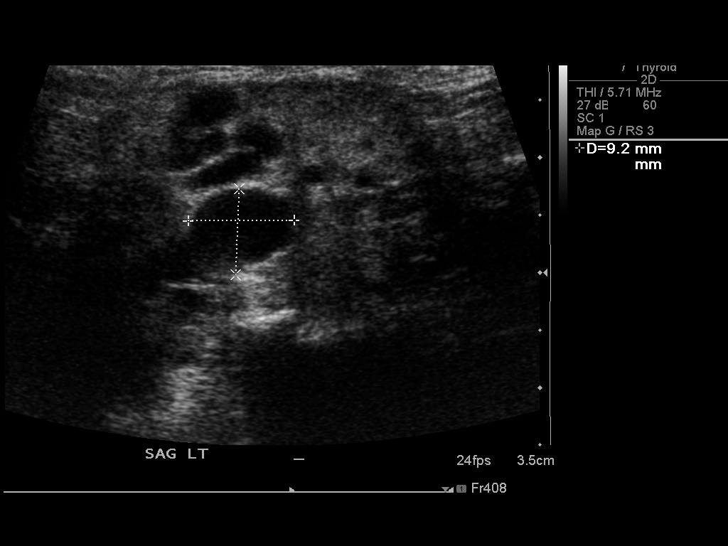
[im 11/31]
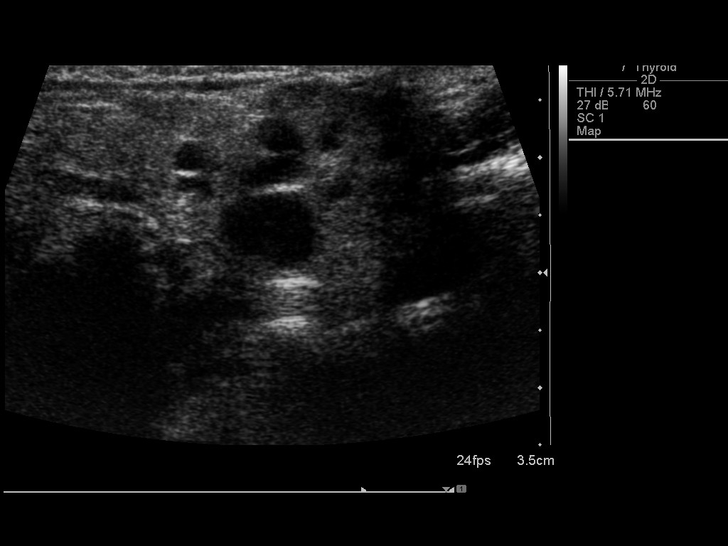
[im 12/31]
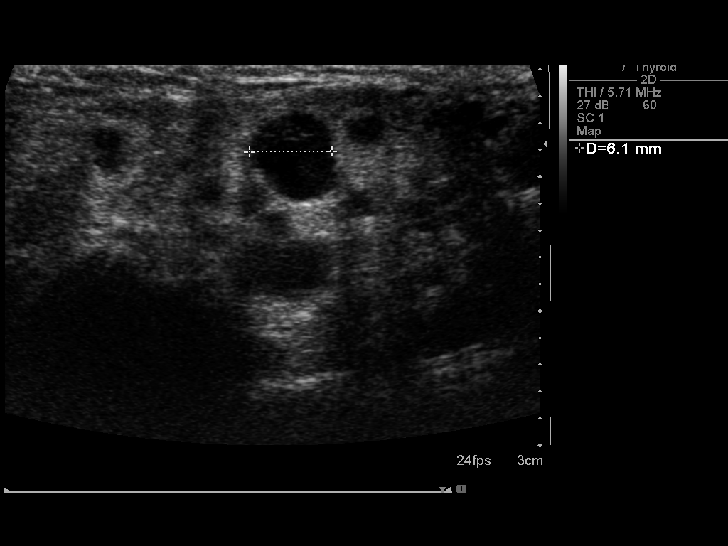
[im 14/31]
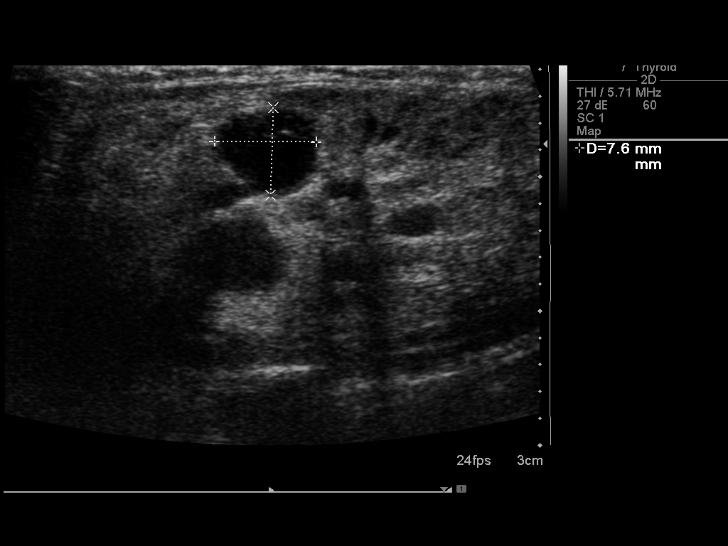
[im 17/31]
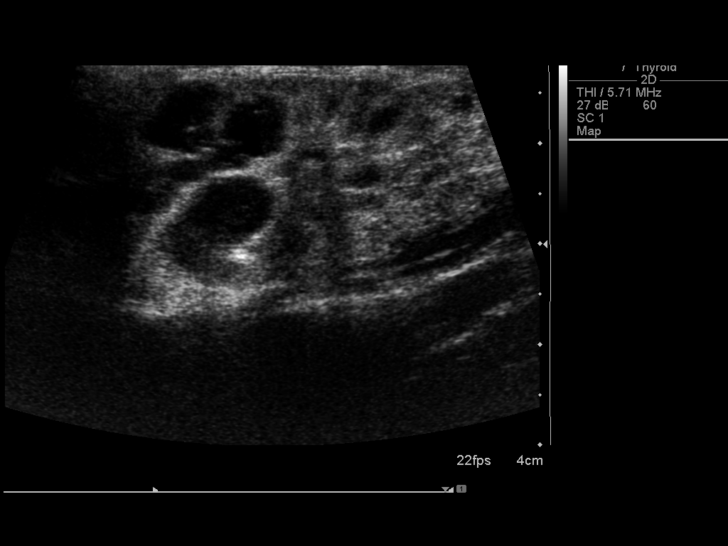
[im 19/31]
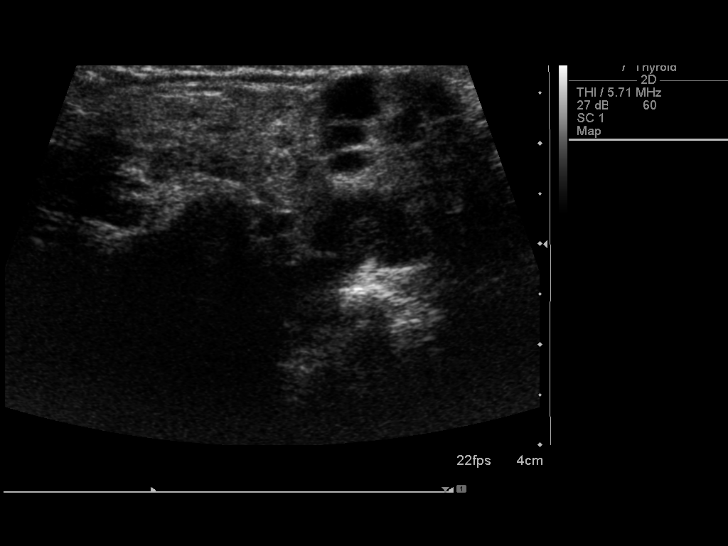
[im 21/31]
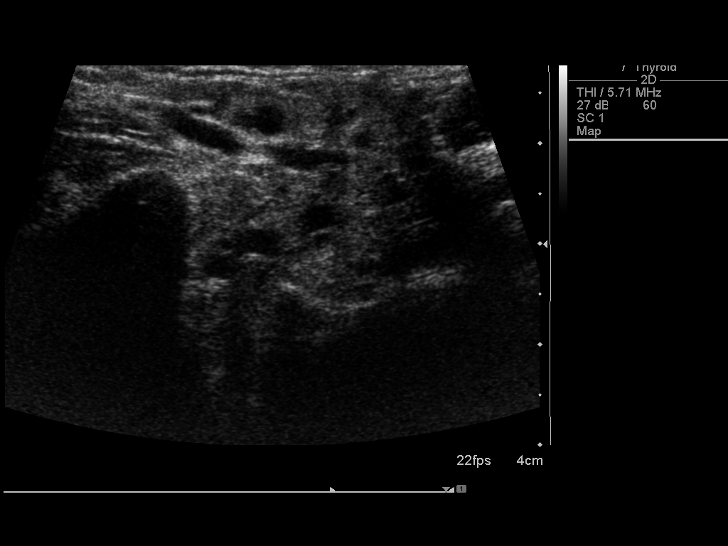
[im 23/31]
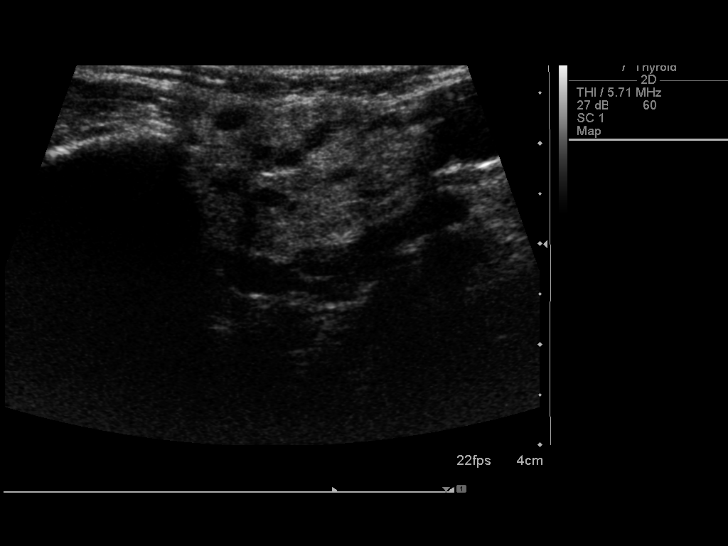
[im 26/31]
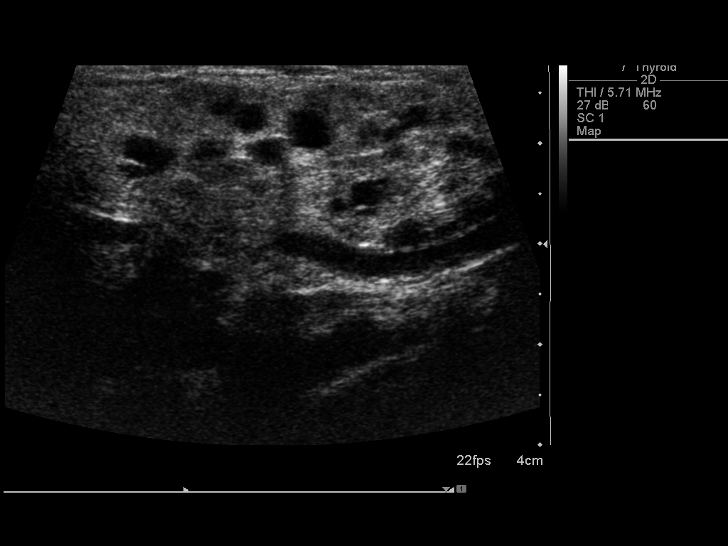
[im 28/31]
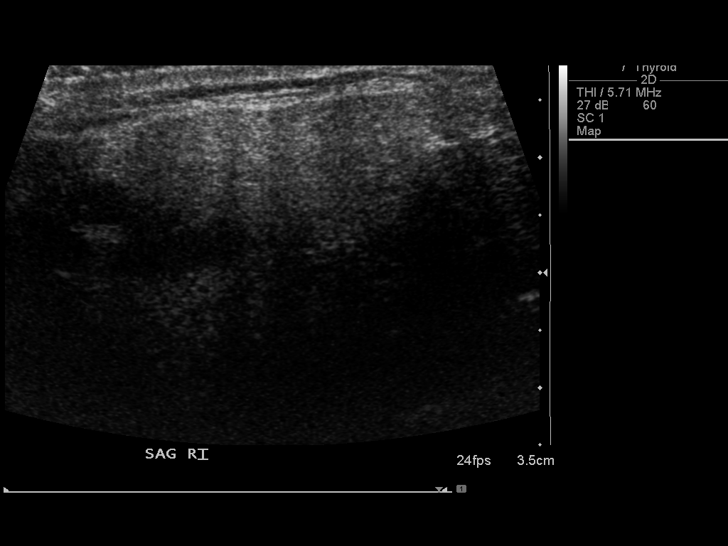
[im 31/31]
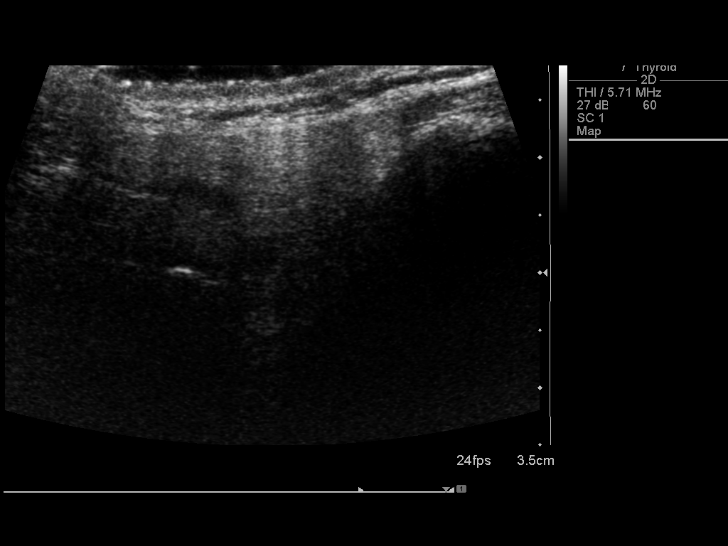

[14 of 25 positions shown; findings below may reference images not displayed]

FINDINGS: The right parotid gland is normal in echogenicity, and without mass

Innumerable cystic lesion scattered throughout the left parotid
gland. Some of these have septations. The largest measures 9 mm in
greatest diameter.
IMPRESSION: Normal-appearing right parotid gland

Innumerable cysts and complex cystic lesions are scattered
throughout the left parotid gland. Inflammatory process or
neoplastic process are not excluded. CT neck with contrast is
recommended to further characterize.

## 2017-10-14 ENCOUNTER — Ambulatory Visit: Payer: Self-pay | Admitting: Family Medicine

## 2019-02-26 ENCOUNTER — Encounter (HOSPITAL_COMMUNITY)
Admission: EM | Disposition: A | Discharge status: Home / Self Care | Payer: Self-pay | Source: Home / Self Care | Attending: Emergency Medicine

## 2019-02-26 ENCOUNTER — Observation Stay (HOSPITAL_COMMUNITY): Payer: Managed Care, Other (non HMO) | Admitting: Certified Registered"

## 2019-02-26 ENCOUNTER — Other Ambulatory Visit: Payer: Self-pay

## 2019-02-26 ENCOUNTER — Emergency Department (HOSPITAL_COMMUNITY): Payer: Managed Care, Other (non HMO)

## 2019-02-26 ENCOUNTER — Encounter (HOSPITAL_COMMUNITY): Payer: Self-pay | Admitting: Emergency Medicine

## 2019-02-26 ENCOUNTER — Observation Stay (HOSPITAL_COMMUNITY)
Admission: EM | Admit: 2019-02-26 | Discharge: 2019-02-26 | Disposition: A | Discharge status: Home / Self Care | Payer: Managed Care, Other (non HMO) | Attending: General Surgery | Admitting: General Surgery

## 2019-02-26 DIAGNOSIS — R1011 Right upper quadrant pain: Secondary | ICD-10-CM

## 2019-02-26 DIAGNOSIS — K810 Acute cholecystitis: Secondary | ICD-10-CM | POA: Diagnosis present

## 2019-02-26 DIAGNOSIS — K8010 Calculus of gallbladder with chronic cholecystitis without obstruction: Secondary | ICD-10-CM | POA: Diagnosis not present

## 2019-02-26 DIAGNOSIS — Z880 Allergy status to penicillin: Secondary | ICD-10-CM | POA: Insufficient documentation

## 2019-02-26 DIAGNOSIS — K5909 Other constipation: Secondary | ICD-10-CM | POA: Diagnosis not present

## 2019-02-26 HISTORY — PX: CHOLECYSTECTOMY: SHX55

## 2019-02-26 LAB — URINALYSIS, ROUTINE W REFLEX MICROSCOPIC
Bilirubin Urine: NEGATIVE
Glucose, UA: NEGATIVE mg/dL
Ketones, ur: NEGATIVE mg/dL
Leukocytes,Ua: NEGATIVE
Nitrite: NEGATIVE
Protein, ur: NEGATIVE mg/dL
Specific Gravity, Urine: 1.005 (ref 1.005–1.030)
pH: 8 (ref 5.0–8.0)

## 2019-02-26 LAB — COMPREHENSIVE METABOLIC PANEL
ALT: 17 U/L (ref 0–44)
AST: 16 U/L (ref 15–41)
Albumin: 3.7 g/dL (ref 3.5–5.0)
Alkaline Phosphatase: 70 U/L (ref 38–126)
Anion gap: 10 (ref 5–15)
BUN: 9 mg/dL (ref 6–20)
CO2: 24 mmol/L (ref 22–32)
Calcium: 9.4 mg/dL (ref 8.9–10.3)
Chloride: 104 mmol/L (ref 98–111)
Creatinine, Ser: 0.82 mg/dL (ref 0.44–1.00)
GFR calc Af Amer: >60 mL/min (ref >60)
GFR calc non Af Amer: >60 mL/min (ref >60)
Glucose, Bld: 91 mg/dL (ref 70–99)
Potassium: 3.9 mmol/L (ref 3.5–5.1)
Sodium: 138 mmol/L (ref 135–145)
Total Bilirubin: 0.4 mg/dL (ref 0.3–1.2)
Total Protein: 7.1 g/dL (ref 6.5–8.1)

## 2019-02-26 LAB — CBC WITH DIFFERENTIAL/PLATELET
Abs Immature Granulocytes: 0.03 10*3/uL (ref 0.00–0.07)
Basophils Absolute: 0.1 10*3/uL (ref 0.0–0.1)
Basophils Relative: 1 %
Eosinophils Absolute: 0.2 10*3/uL (ref 0.0–0.5)
Eosinophils Relative: 2 %
HCT: 41.6 % (ref 36.0–46.0)
Hemoglobin: 13 g/dL (ref 12.0–15.0)
Immature Granulocytes: 0 %
Lymphocytes Relative: 31 %
Lymphs Abs: 2.2 10*3/uL (ref 0.7–4.0)
MCH: 28.6 pg (ref 26.0–34.0)
MCHC: 31.3 g/dL (ref 30.0–36.0)
MCV: 91.4 fL (ref 80.0–100.0)
Monocytes Absolute: 0.8 10*3/uL (ref 0.1–1.0)
Monocytes Relative: 11 %
Neutro Abs: 4 10*3/uL (ref 1.7–7.7)
Neutrophils Relative %: 55 %
Platelets: 315 10*3/uL (ref 150–400)
RBC: 4.55 MIL/uL (ref 3.87–5.11)
RDW: 11.9 % (ref 11.5–15.5)
WBC: 7.2 10*3/uL (ref 4.0–10.5)
nRBC: 0 % (ref 0.0–0.2)

## 2019-02-26 LAB — LIPASE, BLOOD: Lipase: 30 U/L (ref 11–51)

## 2019-02-26 LAB — I-STAT BETA HCG BLOOD, ED (MC, WL, AP ONLY): I-stat hCG, quantitative: <5 m[IU]/mL (ref <5)

## 2019-02-26 SURGERY — LAPAROSCOPIC CHOLECYSTECTOMY WITH INTRAOPERATIVE CHOLANGIOGRAM
Anesthesia: General | Site: Abdomen

## 2019-02-26 MED ORDER — OXYCODONE HCL 5 MG/5ML PO SOLN: 5.0000 mg | Freq: Once | ORAL | Status: AC | PRN

## 2019-02-26 MED ORDER — ZOLPIDEM TARTRATE 5 MG PO TABS: 5.0000 mg | ORAL_TABLET | Freq: Every evening | ORAL | Status: DC | PRN

## 2019-02-26 MED ORDER — IBUPROFEN 600 MG PO TABS
600.0000 mg | ORAL_TABLET | Freq: Three times a day (TID) | ORAL | Status: DC
  Administered 2019-02-26: 15:00:00 600 mg via ORAL
  Filled 2019-02-26: qty 1

## 2019-02-26 MED ORDER — ONDANSETRON HCL 4 MG/2ML IJ SOLN
INTRAMUSCULAR | Status: AC
  Filled 2019-02-26: qty 2

## 2019-02-26 MED ORDER — PHENYLEPHRINE 40 MCG/ML (10ML) SYRINGE FOR IV PUSH (FOR BLOOD PRESSURE SUPPORT)
PREFILLED_SYRINGE | INTRAVENOUS | Status: AC
  Filled 2019-02-26: qty 10

## 2019-02-26 MED ORDER — PROPOFOL 10 MG/ML IV BOLUS
INTRAVENOUS | Status: DC | PRN
  Administered 2019-02-26: 140 mg via INTRAVENOUS

## 2019-02-26 MED ORDER — DIPHENHYDRAMINE HCL 25 MG PO CAPS: 25.0000 mg | ORAL_CAPSULE | Freq: Four times a day (QID) | ORAL | Status: DC | PRN

## 2019-02-26 MED ORDER — OXYCODONE HCL 5 MG PO TABS
5.0000 mg | ORAL_TABLET | Freq: Once | ORAL | Status: AC | PRN
  Administered 2019-02-26: 5 mg via ORAL

## 2019-02-26 MED ORDER — PHENYLEPHRINE 40 MCG/ML (10ML) SYRINGE FOR IV PUSH (FOR BLOOD PRESSURE SUPPORT)
PREFILLED_SYRINGE | INTRAVENOUS | Status: DC | PRN
  Administered 2019-02-26 (×2): 80 ug via INTRAVENOUS
  Administered 2019-02-26: 200 ug via INTRAVENOUS

## 2019-02-26 MED ORDER — CIPROFLOXACIN IN D5W 400 MG/200ML IV SOLN
400.0000 mg | Freq: Once | INTRAVENOUS | Status: AC
  Administered 2019-02-26: 400 mg via INTRAVENOUS
  Filled 2019-02-26: qty 200

## 2019-02-26 MED ORDER — EPHEDRINE 5 MG/ML INJ
INTRAVENOUS | Status: AC
  Filled 2019-02-26: qty 10

## 2019-02-26 MED ORDER — ACETAMINOPHEN 500 MG PO TABS
ORAL_TABLET | ORAL | Status: AC
  Administered 2019-02-26: 12:00:00 1000 mg via ORAL
  Filled 2019-02-26: qty 2

## 2019-02-26 MED ORDER — ONDANSETRON HCL 4 MG/2ML IJ SOLN
INTRAMUSCULAR | Status: DC | PRN
  Administered 2019-02-26: 4 mg via INTRAVENOUS

## 2019-02-26 MED ORDER — FENTANYL CITRATE (PF) 100 MCG/2ML IJ SOLN: 25.0000 ug | INTRAMUSCULAR | Status: DC | PRN

## 2019-02-26 MED ORDER — LIDOCAINE 2% (20 MG/ML) 5 ML SYRINGE
INTRAMUSCULAR | Status: AC
  Filled 2019-02-26: qty 5

## 2019-02-26 MED ORDER — ROCURONIUM BROMIDE 50 MG/5ML IV SOSY
PREFILLED_SYRINGE | INTRAVENOUS | Status: DC | PRN
  Administered 2019-02-26: 50 mg via INTRAVENOUS

## 2019-02-26 MED ORDER — OXYCODONE HCL 5 MG PO TABS: 5.0000 mg | ORAL_TABLET | Freq: Four times a day (QID) | ORAL | 0 refills | Status: AC | PRN

## 2019-02-26 MED ORDER — BUPIVACAINE-EPINEPHRINE (PF) 0.25% -1:200000 IJ SOLN
INTRAMUSCULAR | Status: AC
  Filled 2019-02-26: qty 30

## 2019-02-26 MED ORDER — FENTANYL CITRATE (PF) 250 MCG/5ML IJ SOLN
INTRAMUSCULAR | Status: AC
  Filled 2019-02-26: qty 5

## 2019-02-26 MED ORDER — PROPOFOL 10 MG/ML IV BOLUS
INTRAVENOUS | Status: AC
  Filled 2019-02-26: qty 20

## 2019-02-26 MED ORDER — ACETAMINOPHEN 500 MG PO TABS
1000.0000 mg | ORAL_TABLET | Freq: Four times a day (QID) | ORAL | Status: DC
  Administered 2019-02-26: 1000 mg via ORAL
  Filled 2019-02-26: qty 2

## 2019-02-26 MED ORDER — SIMETHICONE 80 MG PO CHEW: 40.0000 mg | CHEWABLE_TABLET | Freq: Four times a day (QID) | ORAL | Status: DC | PRN

## 2019-02-26 MED ORDER — LACTATED RINGERS IV SOLN
INTRAVENOUS | Status: DC
  Administered 2019-02-26 (×2): via INTRAVENOUS

## 2019-02-26 MED ORDER — BUPIVACAINE-EPINEPHRINE 0.25% -1:200000 IJ SOLN
INTRAMUSCULAR | Status: DC | PRN
  Administered 2019-02-26: 22 mL

## 2019-02-26 MED ORDER — SUCCINYLCHOLINE CHLORIDE 20 MG/ML IJ SOLN
INTRAMUSCULAR | Status: DC | PRN
  Administered 2019-02-26: 100 mg via INTRAVENOUS

## 2019-02-26 MED ORDER — SODIUM CHLORIDE 0.9 % IV SOLN
INTRAVENOUS | Status: DC | PRN
  Administered 2019-02-26: 30 ug/min via INTRAVENOUS

## 2019-02-26 MED ORDER — MIDAZOLAM HCL 2 MG/2ML IJ SOLN
INTRAMUSCULAR | Status: AC
  Filled 2019-02-26: qty 2

## 2019-02-26 MED ORDER — OXYCODONE HCL 5 MG PO TABS: 5.0000 mg | ORAL_TABLET | ORAL | Status: DC | PRN

## 2019-02-26 MED ORDER — PROMETHAZINE HCL 25 MG/ML IJ SOLN: 6.2500 mg | INTRAMUSCULAR | Status: DC | PRN

## 2019-02-26 MED ORDER — ROCURONIUM BROMIDE 50 MG/5ML IV SOSY
PREFILLED_SYRINGE | INTRAVENOUS | Status: AC
  Filled 2019-02-26: qty 5

## 2019-02-26 MED ORDER — EPHEDRINE SULFATE-NACL 50-0.9 MG/10ML-% IV SOSY
PREFILLED_SYRINGE | INTRAVENOUS | Status: DC | PRN
  Administered 2019-02-26: 10 mg via INTRAVENOUS

## 2019-02-26 MED ORDER — DEXAMETHASONE SODIUM PHOSPHATE 10 MG/ML IJ SOLN
INTRAMUSCULAR | Status: DC | PRN
  Administered 2019-02-26: 10 mg via INTRAVENOUS

## 2019-02-26 MED ORDER — DEXAMETHASONE SODIUM PHOSPHATE 10 MG/ML IJ SOLN
INTRAMUSCULAR | Status: AC
  Filled 2019-02-26: qty 1

## 2019-02-26 MED ORDER — SUCCINYLCHOLINE CHLORIDE 200 MG/10ML IV SOSY
PREFILLED_SYRINGE | INTRAVENOUS | Status: AC
  Filled 2019-02-26: qty 10

## 2019-02-26 MED ORDER — ONDANSETRON 4 MG PO TBDP: 4.0000 mg | ORAL_TABLET | Freq: Four times a day (QID) | ORAL | Status: DC | PRN

## 2019-02-26 MED ORDER — LIDOCAINE 2% (20 MG/ML) 5 ML SYRINGE
INTRAMUSCULAR | Status: DC | PRN
  Administered 2019-02-26: 60 mg via INTRAVENOUS

## 2019-02-26 MED ORDER — MORPHINE SULFATE (PF) 2 MG/ML IV SOLN: 1.0000 mg | INTRAVENOUS | Status: DC | PRN

## 2019-02-26 MED ORDER — SODIUM CHLORIDE 0.9 % IV BOLUS
1000.0000 mL | Freq: Once | INTRAVENOUS | Status: AC
  Administered 2019-02-26: 1000 mL via INTRAVENOUS

## 2019-02-26 MED ORDER — OXYCODONE HCL 5 MG PO TABS
ORAL_TABLET | ORAL | Status: AC
  Filled 2019-02-26: qty 1

## 2019-02-26 MED ORDER — MIDAZOLAM HCL 5 MG/5ML IJ SOLN
INTRAMUSCULAR | Status: DC | PRN
  Administered 2019-02-26: 2 mg via INTRAVENOUS

## 2019-02-26 MED ORDER — SODIUM CHLORIDE 0.9 % IR SOLN
Status: DC | PRN
  Administered 2019-02-26: 1000 mL

## 2019-02-26 MED ORDER — KETOROLAC TROMETHAMINE 30 MG/ML IJ SOLN
INTRAMUSCULAR | Status: DC | PRN
  Administered 2019-02-26: 30 mg via INTRAVENOUS

## 2019-02-26 MED ORDER — FENTANYL CITRATE (PF) 100 MCG/2ML IJ SOLN
INTRAMUSCULAR | Status: DC | PRN
  Administered 2019-02-26: 100 ug via INTRAVENOUS
  Administered 2019-02-26 (×2): 50 ug via INTRAVENOUS

## 2019-02-26 MED ORDER — KCL IN DEXTROSE-NACL 20-5-0.45 MEQ/L-%-% IV SOLN
INTRAVENOUS | Status: DC
  Administered 2019-02-26: 15:00:00 via INTRAVENOUS
  Filled 2019-02-26: qty 1000

## 2019-02-26 MED ORDER — DIPHENHYDRAMINE HCL 50 MG/ML IJ SOLN: 25.0000 mg | Freq: Four times a day (QID) | INTRAMUSCULAR | Status: DC | PRN

## 2019-02-26 MED ORDER — SUGAMMADEX SODIUM 200 MG/2ML IV SOLN
INTRAVENOUS | Status: DC | PRN
  Administered 2019-02-26: 300 mg via INTRAVENOUS

## 2019-02-26 MED ORDER — ONDANSETRON HCL 4 MG/2ML IJ SOLN: 4.0000 mg | Freq: Four times a day (QID) | INTRAMUSCULAR | Status: DC | PRN

## 2019-02-26 MED ORDER — KETOROLAC TROMETHAMINE 30 MG/ML IJ SOLN
INTRAMUSCULAR | Status: AC
  Filled 2019-02-26: qty 1

## 2019-02-26 MED ORDER — ENOXAPARIN SODIUM 40 MG/0.4ML ~~LOC~~ SOLN: 40.0000 mg | SUBCUTANEOUS | Status: DC

## 2019-02-26 MED ORDER — ONDANSETRON HCL 4 MG/2ML IJ SOLN: 4.0000 mg | Freq: Once | INTRAMUSCULAR | Status: DC

## 2019-02-26 MED ORDER — 0.9 % SODIUM CHLORIDE (POUR BTL) OPTIME
TOPICAL | Status: DC | PRN
  Administered 2019-02-26: 13:00:00 1000 mL

## 2019-02-26 MED ORDER — DOCUSATE SODIUM 100 MG PO CAPS
100.0000 mg | ORAL_CAPSULE | Freq: Two times a day (BID) | ORAL | Status: DC
  Administered 2019-02-26: 15:00:00 100 mg via ORAL
  Filled 2019-02-26: qty 1

## 2019-02-26 MED ORDER — MORPHINE SULFATE (PF) 4 MG/ML IV SOLN: 4.0000 mg | Freq: Once | INTRAVENOUS | Status: DC

## 2019-02-26 MED ORDER — SCOPOLAMINE 1 MG/3DAYS TD PT72
MEDICATED_PATCH | TRANSDERMAL | Status: AC
  Filled 2019-02-26: qty 1

## 2019-02-26 SURGICAL SUPPLY — 44 items
APL SKNCLS STERI-STRIP NONHPOA (GAUZE/BANDAGES/DRESSINGS) ×1
APPLIER CLIP 5 13 M/L LIGAMAX5 (MISCELLANEOUS) ×3
APR CLP MED LRG 5 ANG JAW (MISCELLANEOUS) ×1
BAG SPEC RTRVL 10 TROC 200 (ENDOMECHANICALS) ×1
BANDAGE ADH SHEER 1  50/CT (GAUZE/BANDAGES/DRESSINGS) ×9 IMPLANT
BENZOIN TINCTURE PRP APPL 2/3 (GAUZE/BANDAGES/DRESSINGS) ×3 IMPLANT
CANISTER SUCT 3000ML PPV (MISCELLANEOUS) ×3 IMPLANT
CHLORAPREP W/TINT 26ML (MISCELLANEOUS) ×3 IMPLANT
CLIP APPLIE 5 13 M/L LIGAMAX5 (MISCELLANEOUS) ×1 IMPLANT
CLOSURE WOUND 1/2 X4 (GAUZE/BANDAGES/DRESSINGS) ×1
COVER SURGICAL LIGHT HANDLE (MISCELLANEOUS) ×3 IMPLANT
DRSG TEGADERM 4X4.75 (GAUZE/BANDAGES/DRESSINGS) ×3 IMPLANT
ELECT REM PT RETURN 9FT ADLT (ELECTROSURGICAL) ×3
ELECTRODE REM PT RTRN 9FT ADLT (ELECTROSURGICAL) ×1 IMPLANT
GAUZE SPONGE 2X2 8PLY STRL LF (GAUZE/BANDAGES/DRESSINGS) ×1 IMPLANT
GLOVE BIOGEL M STRL SZ7.5 (GLOVE) ×3 IMPLANT
GLOVE BIOGEL PI IND STRL 8 (GLOVE) ×2 IMPLANT
GLOVE BIOGEL PI INDICATOR 8 (GLOVE) ×2
GOWN STRL REUS W/ TWL LRG LVL3 (GOWN DISPOSABLE) ×3 IMPLANT
GOWN STRL REUS W/TWL 2XL LVL3 (GOWN DISPOSABLE) ×5 IMPLANT
GOWN STRL REUS W/TWL LRG LVL3 (GOWN DISPOSABLE) ×3
HEMOSTAT SNOW SURGICEL 2X4 (HEMOSTASIS) ×2 IMPLANT
KIT BASIN OR (CUSTOM PROCEDURE TRAY) ×3 IMPLANT
KIT TURNOVER KIT B (KITS) ×3 IMPLANT
NS IRRIG 1000ML POUR BTL (IV SOLUTION) ×3 IMPLANT
PAD ARMBOARD 7.5X6 YLW CONV (MISCELLANEOUS) ×3 IMPLANT
POUCH RETRIEVAL ECOSAC 10 (ENDOMECHANICALS) ×1 IMPLANT
POUCH RETRIEVAL ECOSAC 10MM (ENDOMECHANICALS) ×2
SCISSORS LAP 5X35 DISP (ENDOMECHANICALS) ×3 IMPLANT
SET IRRIG TUBING LAPAROSCOPIC (IRRIGATION / IRRIGATOR) ×3 IMPLANT
SET TUBE SMOKE EVAC HIGH FLOW (TUBING) ×3 IMPLANT
SLEEVE ENDOPATH XCEL 5M (ENDOMECHANICALS) ×6 IMPLANT
SPECIMEN JAR SMALL (MISCELLANEOUS) ×3 IMPLANT
SPONGE GAUZE 2X2 STER 10/PKG (GAUZE/BANDAGES/DRESSINGS) ×2
STRIP CLOSURE SKIN 1/2X4 (GAUZE/BANDAGES/DRESSINGS) ×2 IMPLANT
SUT MNCRL AB 4-0 PS2 18 (SUTURE) ×3 IMPLANT
SUT VIC AB 0 CT1 27 (SUTURE) ×3
SUT VIC AB 0 CT1 27XBRD ANBCTR (SUTURE) IMPLANT
SUT VICRYL 0 UR6 27IN ABS (SUTURE) ×2 IMPLANT
TOWEL OR 17X26 10 PK STRL BLUE (TOWEL DISPOSABLE) ×3 IMPLANT
TRAY LAPAROSCOPIC MC (CUSTOM PROCEDURE TRAY) ×3 IMPLANT
TROCAR XCEL BLUNT TIP 100MML (ENDOMECHANICALS) ×3 IMPLANT
TROCAR XCEL NON-BLD 5MMX100MML (ENDOMECHANICALS) ×3 IMPLANT
WATER STERILE IRR 1000ML POUR (IV SOLUTION) ×3 IMPLANT

## 2019-02-26 NOTE — ED Provider Notes (Signed)
[facility] EMERGENCY DEPARTMENT Provider Note   CSN: [id number] Arrival date & time: 02/26/19  7673    History   Chief Complaint No chief complaint on file.   HPI Suzanne Blair is a 43 y.o. female.     HPI  43 yo F here with RUQ and epigastric pain. Pain began on Wednesday with acute onset of epigastric and RUQ pain after eating cereal w/ milk. Pain is aching, gnawing, but also sharp and worse w/ eating. Worse w/ deep breathing, palpation as well. She has associated nausea, bloating, and fullness sensation. No real improvement w/ belching. No change in BMs. No constipation or diarrhea. No fevers. She is s/p appendectomy, no h/o gallstones. No urianry sx, frequency, hematuria. No vaginal bleeding. She is due to start her menstrual period but denies any vaginal pain, discharge, or h/o menstrual cramps w/ similar symptoms.  Past Medical History:  Diagnosis Date  . Allergy    seasonal  . Carpal tunnel syndrome     Patient Active Problem List   Diagnosis Date Noted  . Abnormal pregnancy 09/05/2011  . Carpal tunnel syndrome     Past Surgical History:  Procedure Laterality Date  . APPENDECTOMY    . CESAREAN SECTION  x2  . DILATION AND EVACUATION  09/05/2011   Procedure: DILATATION AND EVACUATION (D&E);  Surgeon: Clarene Duke, MD;  Location: [facility];  Service: Gynecology;  Laterality: N/A;     OB History    Gravida  2   Para  1   Term  1   Preterm      AB      Living        SAB      TAB      Ectopic      Multiple      Live Births               Home Medications    Prior to Admission medications   Not on File    Family History Family History  Problem Relation Age of Onset  . COPD Mother   . Heart disease Mother   . Hyperlipidemia Mother   . Hypertension Mother   . COPD Father   . Hyperlipidemia Sister   . Hypertension Sister     Social History Social History   Tobacco Use  . Smoking status: Never Smoker   Substance Use Topics  . Alcohol use: No  . Drug use: No     Allergies   Penicillins   Review of Systems Review of Systems  Constitutional: Positive for fatigue. Negative for chills and fever.  HENT: Negative for congestion and rhinorrhea.   Eyes: Negative for visual disturbance.  Respiratory: Negative for cough, shortness of breath and wheezing.   Cardiovascular: Negative for chest pain and leg swelling.  Gastrointestinal: Positive for abdominal pain and nausea. Negative for diarrhea.  Genitourinary: Negative for dysuria and flank pain.  Musculoskeletal: Negative for neck pain and neck stiffness.  Skin: Negative for rash and wound.  Allergic/Immunologic: Negative for immunocompromised state.  Neurological: Positive for weakness. Negative for syncope and headaches.  All other systems reviewed and are negative.    Physical Exam Updated Vital Signs BP 114/81   Pulse 88   Temp 98.4 F (36.9 C) (Oral)   Resp 12   LMP 01/27/2019 (Approximate)   SpO2 99%   Physical Exam Vitals signs and nursing note reviewed.  Constitutional:      General: She is not  in acute distress.    Appearance: She is well-developed.  HENT:     Head: Normocephalic and atraumatic.  Eyes:     Conjunctiva/sclera: Conjunctivae normal.  Neck:     Musculoskeletal: Neck supple.  Cardiovascular:     Rate and Rhythm: Normal rate and regular rhythm.     Heart sounds: Normal heart sounds. No murmur. No friction rub.  Pulmonary:     Effort: Pulmonary effort is normal. No respiratory distress.     Breath sounds: Normal breath sounds. No wheezing or rales.  Abdominal:     General: There is no distension.     Palpations: Abdomen is soft.     Tenderness: There is abdominal tenderness in the right upper quadrant. There is guarding. There is no rebound. Positive signs include Murphy's sign.  Skin:    General: Skin is warm.     Capillary Refill: Capillary refill takes less than 2 seconds.  Neurological:      Mental Status: She is alert and oriented to person, place, and time.     Motor: No abnormal muscle tone.      ED Treatments / Results  Labs (all labs ordered are listed, but only abnormal results are displayed) Labs Reviewed  CBC WITH DIFFERENTIAL/PLATELET  COMPREHENSIVE METABOLIC PANEL  LIPASE, BLOOD  URINALYSIS, ROUTINE W REFLEX MICROSCOPIC  I-STAT BETA HCG BLOOD, ED ([facility], [facility], [facility] ONLY)    EKG EKG Interpretation  Date/Time:  Saturday February 26 2019 07:25:03 EDT Ventricular Rate:  82 PR Interval:    QRS Duration: 92 QT Interval:  365 QTC Calculation: 427 R Axis:   76 Text Interpretation:  Sinus rhythm No old tracing to compare Confirmed by Duffy Bruce [phone]) on 02/26/2019 7:27:19 AM Also confirmed by Duffy Bruce [phone]), editor Philomena Doheny [phone])  on 02/26/2019 8:24:40 AM   Radiology US Abdomen Limited Ruq  Result Date: 02/26/2019 CLINICAL DATA:  Right upper quadrant abdominal pain for 3 days. EXAM: ULTRASOUND ABDOMEN LIMITED RIGHT UPPER QUADRANT COMPARISON:  None. FINDINGS: Gallbladder: Gallbladder is distended. There is a stone in the gallbladder neck measuring 2.4 cm. Wall is thickened to between 4 and 5 mm. There is positive tenderness over the gallbladder to transducer pressure, a positive sonographic Murphy's sign. Common bile duct: Diameter: 3 mm Liver: No focal lesion identified. Within normal limits in parenchymal echogenicity. Portal vein is patent on color Doppler imaging with normal direction of blood flow towards the liver. IMPRESSION: 1. Sonographic findings consistent with acute cholecystitis. Distended gallbladder with a 2.4 cm stone in the neck, mild gallbladder wall thickening and a positive sonographic Murphy's sign. Electronically Signed   By: Lajean Manes M.D.   On: 02/26/2019 08:09    Procedures Procedures (including critical care time)  Medications Ordered in ED Medications  morphine 4 MG/ML injection 4 mg (0 mg Intravenous Hold 02/26/19 0724)   ondansetron (ZOFRAN) injection 4 mg (0 mg Intravenous Hold 02/26/19 0724)  ciprofloxacin (CIPRO) IVPB 400 mg (has no administration in time range)  sodium chloride 0.9 % bolus 1,000 mL (1,000 mLs Intravenous New Bag/Given 02/26/19 0723)     Initial Impression / Assessment and Plan / ED Course  I have reviewed the triage vital signs and the nursing notes.  Pertinent labs & imaging results that were available during my care of the patient were reviewed by me and considered in my medical decision making (see chart for details).  Clinical Course as of Feb 26 824  Sat Feb 26, 2019  0714 43 yo  F with PMHx appendectomy here with RUQ and epigastric pain. Suspect biliary colic vs cholecystitis, less likely PUD or IBS. Will check U/S, labs. Abdomen w/ epigastric and RUQ TTP but no peritoneal signs. VSS.    [CI]  [id number] RUQ U/S is c/f cholecystitis, though labs are reassuring. CCS consulted.   [CI]  [id number] Cipro given (h/o allergy to Penicillin).   [CI]    Clinical Course User Index [CI] Duffy Bruce, MD       Final Clinical Impressions(s) / ED Diagnoses   Final diagnoses:  RUQ pain  Acute cholecystitis    ED Discharge Orders    None       Duffy Bruce, MD 02/26/19 [phone]

## 2019-02-26 NOTE — H&P (Signed)
Suzanne Blair 09/09/1976  [id number].    Chief Complaint/Reason for Consult: RUQ/epigastric abdominal pain  HPI:  This is a 43 yo WF with no significant PMH who has been dealing with gas and gaseous symptoms with increasing frequency over the last year.  On Wednesday she ate some cereal and developed epigastric and RUQ abdominal pain along with nausea.  She felt bloated along with gas pains.  She took miralax due to chronic constipation thinking this would help along with rolaids and other OTC meds for gas.  This may have intermittently helped some, but pain has persisted over the last couple of days.  No diarrhea, fevers, or vomiting.  She has eaten a very small amount since Wednesday which only tends to exacerbate her symptoms.  She was awoken from sleep this am at 0500 due to worsening pain that felt like " a knife going through her epigastrium towards her back."  She came to the ED where she was evaluated and worked up.  She was noted to have normal labs, but an [place] that revealed a 2.4cm stone in the neck of the gallbladder along with mild wall thickening and Murphy's sign.  We have been asked to see her for further evaluation and recommendations.  She has been screened for COVID-19 and is negative according to the screening.  ROS: ROS: Please see HPI, otherwise all other systems have been reviewed and are negative.  Family History  Problem Relation Age of Onset   COPD Mother    Heart disease Mother    Hyperlipidemia Mother    Hypertension Mother    COPD Father    Hyperlipidemia Sister    Hypertension Sister     Past Medical History:  Diagnosis Date   Allergy    seasonal   Carpal tunnel syndrome     Past Surgical History:  Procedure Laterality Date   APPENDECTOMY     CESAREAN SECTION  x2   DILATION AND EVACUATION  09/05/2011   Procedure: DILATATION AND EVACUATION (D&E);  Surgeon: Clarene Duke, MD;  Location: [facility];  Service: Gynecology;   Laterality: N/A;    Social History:  reports that she has never smoked. She has never used smokeless tobacco. She reports that she does not drink alcohol or use drugs.  Allergies:  Allergies  Allergen Reactions   Penicillins Hives and Rash    Childhood reaction Did it involve swelling of the face/tongue/throat, SOB, or low BP? No Did it involve sudden or severe rash/hives, skin peeling, or any reaction on the inside of your mouth or nose? Yes Did you need to seek medical attention at a hospital or doctor's office? Unknown When did it last happen?childhood If all above answers are NO, may proceed with cephalosporin use.     (Not in a hospital admission)    Physical Exam: Blood pressure 112/80, pulse (!) 101, temperature 98.4 F (36.9 C), temperature source Oral, resp. rate 14, last menstrual period 01/27/2019, SpO2 100 %. General: pleasant, WD, WN white female who is laying in bed in NAD HEENT: head is normocephalic, atraumatic.  Sclera are noninjected.  PERRL.  Ears and nose without any masses or lesions.  Mouth is pink and moist Heart: regular, rate, and rhythm.  Normal s1,s2. No obvious murmurs, gallops, or rubs noted.  Palpable radial and pedal pulses bilaterally Lungs: CTAB, no wheezes, rhonchi, or rales noted.  Respiratory effort nonlabored Abd: soft, tender in RUQ with + Murphy's sign, ND, +BS, no  masses, hernias, or organomegaly MS: all 4 extremities are symmetrical with no cyanosis, clubbing, or edema. Skin: warm and dry with no masses, lesions, or rashes Psych: A&Ox3 with an appropriate affect.   Results for orders placed or performed during the hospital encounter of 02/26/19 (from the past 48 hour(s))  CBC with Differential     Status: None   Collection Time: 02/26/19  6:59 AM  Result Value Ref Range   WBC 7.2 4.0 - 10.5 K/uL   RBC 4.55 3.87 - 5.11 MIL/uL   Hemoglobin 13.0 12.0 - 15.0 g/dL   HCT 41.6 36.0 - 46.0 %   MCV 91.4 80.0 - 100.0 fL   MCH 28.6 26.0 -  34.0 pg   MCHC 31.3 30.0 - 36.0 g/dL   RDW 11.9 11.5 - 15.5 %   Platelets 315 150 - 400 K/uL   nRBC 0.0 0.0 - 0.2 %   Neutrophils Relative % 55 %   Neutro Abs 4.0 1.7 - 7.7 K/uL   Lymphocytes Relative 31 %   Lymphs Abs 2.2 0.7 - 4.0 K/uL   Monocytes Relative 11 %   Monocytes Absolute 0.8 0.1 - 1.0 K/uL   Eosinophils Relative 2 %   Eosinophils Absolute 0.2 0.0 - 0.5 K/uL   Basophils Relative 1 %   Basophils Absolute 0.1 0.0 - 0.1 K/uL   Immature Granulocytes 0 %   Abs Immature Granulocytes 0.03 0.00 - 0.07 K/uL    Comment: Performed at [facility], [address], [place], [place] [address]  Comprehensive metabolic panel     Status: None   Collection Time: 02/26/19  6:59 AM  Result Value Ref Range   Sodium 138 135 - 145 mmol/L   Potassium 3.9 3.5 - 5.1 mmol/L   Chloride 104 98 - 111 mmol/L   CO2 24 22 - 32 mmol/L   Glucose, Bld 91 70 - 99 mg/dL   BUN 9 6 - 20 mg/dL   Creatinine, Ser 0.82 0.44 - 1.00 mg/dL   Calcium 9.4 8.9 - 10.3 mg/dL   Total Protein 7.1 6.5 - 8.1 g/dL   Albumin 3.7 3.5 - 5.0 g/dL   AST 16 15 - 41 U/L   ALT 17 0 - 44 U/L   Alkaline Phosphatase 70 38 - 126 U/L   Total Bilirubin 0.4 0.3 - 1.2 mg/dL   GFR calc non Af Amer >60 >60 mL/min   GFR calc Af Amer >60 >60 mL/min   Anion gap 10 5 - 15    Comment: Performed at [facility], [place] [address], [place], [place] [address]  Lipase, blood     Status: None   Collection Time: 02/26/19  6:59 AM  Result Value Ref Range   Lipase 30 11 - 51 U/L    Comment: Performed at [facility] [address] [place], [place] [address]  I-Stat Beta hCG blood, ED ([facility], [facility], [facility] only)     Status: None   Collection Time: 02/26/19  7:15 AM  Result Value Ref Range   I-stat hCG, quantitative <5.0 <5 mIU/mL   Comment 3            Comment:   GEST. AGE      CONC.  (mIU/mL)   <=1 WEEK        5 - 50     2 WEEKS       50 - 500     3 WEEKS  100 - 10,000     4 WEEKS     1,000 - 30,000        FEMALE AND  NON-PREGNANT FEMALE:     LESS THAN 5 mIU/mL    US Abdomen Limited Ruq  Result Date: 02/26/2019 CLINICAL DATA:  Right upper quadrant abdominal pain for 3 days. EXAM: ULTRASOUND ABDOMEN LIMITED RIGHT UPPER QUADRANT COMPARISON:  None. FINDINGS: Gallbladder: Gallbladder is distended. There is a stone in the gallbladder neck measuring 2.4 cm. Wall is thickened to between 4 and 5 mm. There is positive tenderness over the gallbladder to transducer pressure, a positive sonographic Murphy's sign. Common bile duct: Diameter: 3 mm Liver: No focal lesion identified. Within normal limits in parenchymal echogenicity. Portal vein is patent on color Doppler imaging with normal direction of blood flow towards the liver. IMPRESSION: 1. Sonographic findings consistent with acute cholecystitis. Distended gallbladder with a 2.4 cm stone in the neck, mild gallbladder wall thickening and a positive sonographic Murphy's sign. Electronically Signed   By: Lajean Manes M.D.   On: 02/26/2019 08:09      Assessment/Plan Calculous cholecystitis The patient appears to have early cholecystitis given she has a large gallstone lodged in the neck with of the gallbladder with some mild wall thickening and RUQ pain.  She will be admitted to undergo lap chole with +/- IOC.  She will remain NPO to hopefully move forward today.  She is currently receiving a dose of Cipro which should be sufficient peri-operatively.  She will be given tylenol and ibuprofen as well to help with pain management.   I have explained the procedure, risks, and aftercare of cholecystectomy.  Risks include but are not limited to bleeding, infection, wound problems, anesthesia, diarrhea, bile leak, injury to common bile duct/liver/intestine.  She seems to understand and agrees to proceed.  FEN - NPO/IVFs VTE - Lovenox to start 4/5 if here ID - [place], [facility] 02/26/2019, 10:32 AM Pager: [phone]

## 2019-02-26 NOTE — Progress Notes (Signed)
Patient arrived to 6n05 A&OX4, VSS, RAC IV intact and infusing.  Noted to have 4 lap sites on abdomen with gauze, steristrips, and bandaids.  Rates pain 3/10.  Patient oriented to room and equipment.  Will continue to monitor.

## 2019-02-26 NOTE — Progress Notes (Signed)
1 bag of belongings taken to PACU.

## 2019-02-26 NOTE — ED Notes (Signed)
Patient transported to Ultrasound 

## 2019-02-26 NOTE — Discharge Instructions (Signed)
Our office will call you with an appointment date and time that our PA in the office will call you for follow up.  If you have any questions or concerns about your incisions please take a picture and send to [email or web address]     Managing Your Pain After Surgery Without Opioids    Thank you for participating in our program to help patients manage their pain after surgery without opioids. This is part of our effort to provide you with the best care possible, without exposing you or your family to the risk that opioids pose.  What pain can I expect after surgery? You can expect to have some pain after surgery. This is normal. The pain is typically worse the day after surgery, and quickly begins to get better. Many studies have found that many patients are able to manage their pain after surgery with Over-the-Counter (OTC) medications such as Tylenol and Motrin. If you have a condition that does not allow you to take Tylenol or Motrin, notify your surgical team.  How will I manage my pain? The best strategy for controlling your pain after surgery is around the clock pain control with Tylenol (acetaminophen) and Motrin (ibuprofen or Advil). Alternating these medications with each other allows you to maximize your pain control. In addition to Tylenol and Motrin, you can use heating pads or ice packs on your incisions to help reduce your pain.  How will I alternate your regular strength over-the-counter pain medication? You will take a dose of pain medication every three hours. ; Start by taking 650 mg of Tylenol (2 pills of 325 mg) ; 3 hours later take 600 mg of Motrin (3 pills of 200 mg) ; 3 hours after taking the Motrin take 650 mg of Tylenol ; 3 hours after that take 600 mg of Motrin.   - 1 -  See example - if your first dose of Tylenol is at 12:00 PM   12:00 PM Tylenol 650 mg (2 pills of 325 mg)  3:00 PM Motrin 600 mg (3 pills of 200 mg)  6:00 PM Tylenol 650  mg (2 pills of 325 mg)  9:00 PM Motrin 600 mg (3 pills of 200 mg)  Continue alternating every 3 hours   We recommend that you follow this schedule around-the-clock for at least 3 days after surgery, or until you feel that it is no longer needed. Use the table on the last page of this handout to keep track of the medications you are taking. Important: Do not take more than 3023m of Tylenol or 23015mof Motrin in a 24-hour period. Do not take ibuprofen/Motrin if you have a history of bleeding stomach ulcers, severe kidney disease, &/or actively taking a blood thinner  What if I still have pain? If you have pain that is not controlled with the over-the-counter pain medications (Tylenol and Motrin or Advil) you might have what we call breakthrough pain. You will receive a prescription for a small amount of an opioid pain medication such as Oxycodone, Tramadol, or Tylenol with Codeine. Use these opioid pills in the first 24 hours after surgery if you have breakthrough pain. Do not take more than 1 pill every 4-6 hours.  If you still have uncontrolled pain after using all opioid pills, don't hesitate to call our staff using the number provided. We will help make sure you are managing your pain in the best way possible, and if necessary, we can provide a prescription for additional pain medication.  Day 1    Time  Name of Medication Number of pills taken  Amount of Acetaminophen  Pain Level   Comments  AM PM       AM PM       AM PM       AM PM       AM PM       AM PM       AM PM       AM PM       Total Daily amount of Acetaminophen Do not take more than  3,000 mg per day      Day 2    Time  Name of Medication Number of pills taken  Amount of Acetaminophen  Pain Level   Comments  AM PM       AM PM       AM PM       AM PM       AM PM       AM PM       AM PM       AM PM       Total Daily amount of Acetaminophen Do not take more than  3,000 mg per day      Day 3     Time  Name of Medication Number of pills taken  Amount of Acetaminophen  Pain Level   Comments  AM PM       AM PM       AM PM       AM PM          AM PM       AM PM       AM PM       AM PM       Total Daily amount of Acetaminophen Do not take more than  3,000 mg per day      Day 4    Time  Name of Medication Number of pills taken  Amount of Acetaminophen  Pain Level   Comments  AM PM       AM PM       AM PM       AM PM       AM PM       AM PM       AM PM       AM PM       Total Daily amount of Acetaminophen Do not take more than  3,000 mg per day      Day 5    Time  Name of Medication Number of pills taken  Amount of Acetaminophen  Pain Level   Comments  AM PM       AM PM       AM PM       AM PM       AM PM       AM PM       AM PM       AM PM       Total Daily amount of Acetaminophen Do not take more than  3,000 mg per day       Day 6    Time  Name of Medication Number of pills taken  Amount of Acetaminophen  Pain Level  Comments  AM PM       AM PM       AM PM       AM PM  AM PM       AM PM       AM PM       AM PM       Total Daily amount of Acetaminophen Do not take more than  3,000 mg per day      Day 7    Time  Name of Medication Number of pills taken  Amount of Acetaminophen  Pain Level   Comments  AM PM       AM PM       AM PM       AM PM       AM PM       AM PM       AM PM       AM PM       Total Daily amount of Acetaminophen Do not take more than  3,000 mg per day        For additional information about how and where to safely dispose of unused opioid medications - [employer]  Disclaimer: This document contains information and/or instructional materials adapted from [facility] for the typical patient with your condition. It does not replace medical advice from your health care provider because your experience may differ from that of the typical patient. Talk to your  health care provider if you have any questions about this document, your condition or your treatment plan. Adapted from [facility]  LAPAROSCOPIC SURGERY: POST OP INSTRUCTIONS Always review your discharge instruction sheet given to you by the facility where your surgery was performed. IF YOU HAVE DISABILITY OR FAMILY LEAVE FORMS, YOU MUST BRING THEM TO THE OFFICE FOR PROCESSING.   DO NOT GIVE THEM TO YOUR DOCTOR.  PAIN CONTROL  1. First take acetaminophen (Tylenol) AND/or ibuprofen (Advil) to control your pain after surgery.  Follow directions on package.  Taking acetaminophen (Tylenol) and/or ibuprofen (Advil) regularly after surgery will help to control your pain and lower the amount of prescription pain medication you may need.  You should not take more than 4,000 mg (4 grams) of acetaminophen (Tylenol) in 24 hours.  You should not take ibuprofen (Advil), aleve, motrin, naprosyn or other NSAIDS if you have a history of stomach ulcers or chronic kidney disease.  2. A prescription for pain medication may be given to you upon discharge.  Take your pain medication as prescribed, if you still have uncontrolled pain after taking acetaminophen (Tylenol) or ibuprofen (Advil). 3. Use ice packs to help control pain. 4. If you need a refill on your pain medication, please contact your pharmacy.  They will contact our office to request authorization. Prescriptions will not be filled after 5pm or on week-ends.  HOME MEDICATIONS 5. Take your usually prescribed medications unless otherwise directed.  DIET 6. You should follow a light diet the first few days after arrival home.  Be sure to include lots of fluids daily. Avoid fatty, fried foods.   CONSTIPATION 7. It is common to experience some constipation after surgery and if you are taking pain medication.  Increasing fluid intake and taking a stool softener (such as Colace) will usually help or prevent this  problem from occurring.  A mild laxative (Milk of Magnesia or Miralax) should be taken according to package instructions if there are no bowel movements after 48 hours.  WOUND/INCISION CARE 8. Most patients will experience some swelling and bruising in the area of the incisions.  Ice packs will help.  Swelling and bruising can take several days to resolve.  9. Unless discharge instructions indicate otherwise, follow guidelines below  a. STERI-STRIPS - you may remove your outer bandages 48 hours after surgery, and you may shower at that time.  You have steri-strips (small skin tapes) in place directly over the incision.  These strips should be left on the skin for 7-10 days.   b. DERMABOND/SKIN GLUE - you may shower in 24 hours.  The glue will flake off over the next 2-3 weeks. 10. Any sutures or staples will be removed at the office during your follow-up visit.  ACTIVITIES 11. You may resume regular (light) daily activities beginning the next day--such as daily self-care, walking, climbing stairs--gradually increasing activities as tolerated.  You may have sexual intercourse when it is comfortable.  Refrain from any heavy lifting or straining until approved by your doctor. a. You may drive when you are no longer taking prescription pain medication, you can comfortably wear a seatbelt, and you can safely maneuver your car and apply brakes.  FOLLOW-UP 12. You should see your doctor in the office for a follow-up appointment approximately 2-3 weeks after your surgery.  You should have been given your post-op/follow-up appointment when your surgery was scheduled.  If you did not receive a post-op/follow-up appointment, make sure that you call for this appointment within a day or two after you arrive home to insure a convenient appointment time.   WHEN TO CALL YOUR DOCTOR: 1. Fever over 101.0 2. Inability to urinate 3. Continued bleeding from incision. 4. Increased pain, redness, or drainage from the  incision. 5. Increasing abdominal pain  The clinic staff is available to answer your questions during regular business hours.  Please dont hesitate to call and ask to speak to one of the nurses for clinical concerns.  If you have a medical emergency, go to the nearest emergency room or call 911.  A surgeon from [facility] is always on call at the hospital. [address] [place], [place], [place]  [address] ? [address], [place],    [address] [phone] ? [phone] ? FAX [phone]

## 2019-02-26 NOTE — Discharge Summary (Signed)
Physician Discharge Summary  ALIZIA GREIF XIP:[id number] DOB: 06/01/1976 DOA: 02/26/2019  PCP: Patient, No Pcp Per  Admit date: 02/26/2019 Discharge date: 02/26/2019  Recommendations for Outpatient Follow-up:    Follow-up Information    [facility] [place] Follow up in 2 week(s).   Specialty:  General Surgery Why:  Our office will call you with an appointment date and time that our PA in the office will call you for follow up.  If you have any questions or concerns about your incisions please take a picture and send to [email or web address] Contact information: [place] [address] [phone]          Discharge Diagnoses:  1. Acute calculus cholecystitis  Surgical Procedure: Laparoscopic cholecystectomy February 26, 2019 Dr. Redmond Pulling  Discharge Condition: Good Disposition: Home  Diet recommendation: Regular  There were no vitals filed for this visit.  History of present illness:  Patient came to the emergency room for ongoing epigastric and right upper quadrant pain and nausea.  She was found to have symptoms consistent with acute calculus cholecystitis  Hospital Course:  Patient was started on IV antibiotics.  She was given gabapentin and oral Tylenol for preoperative and postoperative analgesia.  She was taken to the OR for laparoscopic cholecystectomy.  She had acute calculus cholecystitis.  Postoperatively she was transferred to the recovery room and then to the floor.  She was started on clear liquids and her diet was advanced.  Her pain was controlled.  We discussed discharge instructions.  She was felt stable for discharge   Discharge Instructions  Discharge Instructions    Call MD for:   Complete by:  As directed    Temperature >101   Call MD for:  hives   Complete by:  As directed    Call MD for:  persistant dizziness or light-headedness   Complete by:  As directed    Call MD for:  persistant nausea and vomiting    Complete by:  As directed    Call MD for:  redness, tenderness, or signs of infection (pain, swelling, redness, odor or green/yellow discharge around incision site)   Complete by:  As directed    Call MD for:  severe uncontrolled pain   Complete by:  As directed    Diet general   Complete by:  As directed    Discharge instructions   Complete by:  As directed    See [facility] discharge instructions   Increase activity slowly   Complete by:  As directed      Allergies as of 02/26/2019      Reactions   Penicillins Hives, Rash   Childhood reaction Did it involve swelling of the face/tongue/throat, SOB, or low BP? No Did it involve sudden or severe rash/hives, skin peeling, or any reaction on the inside of your mouth or nose? Yes Did you need to seek medical attention at a hospital or doctor's office? Unknown When did it last happen?childhood If all above answers are "NO", may proceed with cephalosporin use.      Medication List    TAKE these medications   acetaminophen 500 MG tablet Commonly known as:  TYLENOL Take 500 mg by mouth every 6 (six) hours as needed for mild pain.   oxyCODONE 5 MG immediate release tablet Commonly known as:  Oxy IR/ROXICODONE Take 1 tablet (5 mg total) by mouth every 6 (six) hours as needed for severe pain.   PROBIOTIC PO Take 1 tablet by mouth  daily.      Follow-up Information    [facility] Follow up in 2 week(s).   Specialty:  General Surgery Why:  Our office will call you with an appointment date and time that our PA in the office will call you for follow up.  If you have any questions or concerns about your incisions please take a picture and send to [email or web address] Contact information: [place] [address] [phone]            The results of significant diagnostics from this hospitalization (including imaging, microbiology, ancillary and laboratory) are listed below for  reference.    Significant Diagnostic Studies: US Abdomen Limited Ruq  Result Date: 02/26/2019 CLINICAL DATA:  Right upper quadrant abdominal pain for 3 days. EXAM: ULTRASOUND ABDOMEN LIMITED RIGHT UPPER QUADRANT COMPARISON:  None. FINDINGS: Gallbladder: Gallbladder is distended. There is a stone in the gallbladder neck measuring 2.4 cm. Wall is thickened to between 4 and 5 mm. There is positive tenderness over the gallbladder to transducer pressure, a positive sonographic Murphy's sign. Common bile duct: Diameter: 3 mm Liver: No focal lesion identified. Within normal limits in parenchymal echogenicity. Portal vein is patent on color Doppler imaging with normal direction of blood flow towards the liver. IMPRESSION: 1. Sonographic findings consistent with acute cholecystitis. Distended gallbladder with a 2.4 cm stone in the neck, mild gallbladder wall thickening and a positive sonographic Murphy's sign. Electronically Signed   By: Lajean Manes M.D.   On: 02/26/2019 08:09    Microbiology: No results found for this or any previous visit (from the past 240 hour(s)).   Labs: Basic Metabolic Panel: Recent Labs  Lab 02/26/19 0659  NA 138  K 3.9  CL 104  CO2 24  GLUCOSE 91  BUN 9  CREATININE 0.82  CALCIUM 9.4   Liver Function Tests: Recent Labs  Lab 02/26/19 0659  AST 16  ALT 17  ALKPHOS 70  BILITOT 0.4  PROT 7.1  ALBUMIN 3.7   Recent Labs  Lab 02/26/19 0659  LIPASE 30   No results for input(s): AMMONIA in the last 168 hours. CBC: Recent Labs  Lab 02/26/19 0659  WBC 7.2  NEUTROABS 4.0  HGB 13.0  HCT 41.6  MCV 91.4  PLT 315   Cardiac Enzymes: No results for input(s): CKTOTAL, CKMB, CKMBINDEX, TROPONINI in the last 168 hours. BNP: BNP (last 3 results) No results for input(s): BNP in the last 8760 hours.  ProBNP (last 3 results) No results for input(s): PROBNP in the last 8760 hours.  CBG: No results for input(s): GLUCAP in the last 168 hours.  Active Problems:    Acute cholecystitis   Time coordinating discharge: 10 minutes  Signed:  Gayland Curry, MD [facility] [place] 02/26/2019, 6:24 PM

## 2019-02-26 NOTE — Progress Notes (Signed)
Suzanne Blair to be D/C'd  per MD order. Discussed with the patient and all questions fully answered.  VSS, Skin clean, dry and intact without evidence of skin break down, no evidence of skin tears noted.  IV catheter discontinued intact. Site without signs and symptoms of complications. Dressing and pressure applied.  An After Visit Summary was printed and given to the patient. Patient informed of where to pick up prescription.  D/c education completed with patient/family including follow up instructions, medication list, d/c activities limitations if indicated, with other d/c instructions as indicated by MD - patient able to verbalize understanding, all questions fully answered.   Patient instructed to return to ED, call 911, or call MD for any changes in condition.   Patient to be escorted via [place], and D/C home via private auto.

## 2019-02-26 NOTE — Anesthesia Postprocedure Evaluation (Signed)
Anesthesia Post Note  Patient: Suzanne Blair  Procedure(s) Performed: LAPAROSCOPIC CHOLECYSTECTOMY (N/A Abdomen)     Patient location during evaluation: PACU Anesthesia Type: General Level of consciousness: awake and alert Pain management: pain level controlled Vital Signs Assessment: post-procedure vital signs reviewed and stable Respiratory status: spontaneous breathing, nonlabored ventilation and respiratory function stable Cardiovascular status: blood pressure returned to baseline and stable Postop Assessment: no apparent nausea or vomiting Anesthetic complications: no    Last Vitals:  Vitals:   02/26/19 1355 02/26/19 1420  BP: 117/73 115/78  Pulse: 99 (!) 107  Resp: 12 17  Temp:  (!) 36.4 C  SpO2: 100% 100%    Last Pain:  Vitals:   02/26/19 1420  TempSrc: Oral  PainSc: 3                  Audry Pili

## 2019-02-26 NOTE — ED Triage Notes (Addendum)
Pt here with c/o ABD pain that radiates to the back that is worth with food consumption.  Pt denies any N/V/D or fevers.

## 2019-02-26 NOTE — Transfer of Care (Signed)
Immediate Anesthesia Transfer of Care Note  Patient: Suzanne Blair  Procedure(s) Performed: LAPAROSCOPIC CHOLECYSTECTOMY (N/A Abdomen)  Patient Location: PACU  Anesthesia Type:General  Level of Consciousness: awake and patient cooperative  Airway & Oxygen Therapy: Patient Spontanous Breathing  Post-op Assessment: Report given to RN and Post -op Vital signs reviewed and stable  Post vital signs: Reviewed and stable  Last Vitals:  Vitals Value Taken Time  BP    Temp    Pulse    Resp    SpO2      Last Pain:  Vitals:   02/26/19 0709  TempSrc:   PainSc: 4          Complications: No apparent anesthesia complications

## 2019-02-26 NOTE — Anesthesia Preprocedure Evaluation (Addendum)
Anesthesia Evaluation  Patient identified by MRN, date of birth, ID band Patient awake    Reviewed: Allergy & Precautions, NPO status , Patient's Chart, lab work & pertinent test results  History of Anesthesia Complications Negative for: history of anesthetic complications  Airway Mallampati: I  TM Distance: >3 FB Neck ROM: Full    Dental  (+) Dental Advisory Given, Teeth Intact   Pulmonary neg pulmonary ROS,    breath sounds clear to auscultation       Cardiovascular negative cardio ROS   Rhythm:Regular Rate:Normal     Neuro/Psych  Carpal tunnel syndrome  negative psych ROS   GI/Hepatic Neg liver ROS,  Cholecystitis    Endo/Other  negative endocrine ROS  Renal/GU negative Renal ROS     Musculoskeletal negative musculoskeletal ROS (+)   Abdominal   Peds  Hematology negative hematology ROS (+)   Anesthesia Other Findings   Reproductive/Obstetrics                           Anesthesia Physical Anesthesia Plan  ASA: I and emergent  Anesthesia Plan: General   Post-op Pain Management:    Induction: Intravenous  PONV Risk Score and Plan: 4 or greater and Treatment may vary due to age or medical condition, Ondansetron, Dexamethasone, Midazolam and Scopolamine patch - Pre-op  Airway Management Planned: Oral ETT  Additional Equipment: None  Intra-op Plan:   Post-operative Plan: Extubation in OR  Informed Consent: I have reviewed the patients History and Physical, chart, labs and discussed the procedure including the risks, benefits and alternatives for the proposed anesthesia with the patient or authorized representative who has indicated his/her understanding and acceptance.     Dental advisory given  Plan Discussed with: CRNA and Anesthesiologist  Anesthesia Plan Comments:        Anesthesia Quick Evaluation

## 2019-02-26 NOTE — Op Note (Signed)
Suzanne Blair [id number] [birth year] 02/26/2019  Laparoscopic Cholecystectomy Procedure Note  Indications: This patient presents with symptomatic gallbladder disease and will undergo laparoscopic cholecystectomy.  Pre-operative Diagnosis: symptomatic cholelithiasis  Post-operative Diagnosis: Calculus of gallbladder with acute cholecystitis, without mention of obstruction  Surgeon: Greer Pickerel MD FACS  Assistants: none  Anesthesia: General endotracheal anesthesia  Procedure Details  The patient was seen again in the Holding Room. The risks, benefits, complications, treatment options, and expected outcomes were discussed with the patient. The possibilities of reaction to medication, pulmonary aspiration, perforation of viscus, bleeding, recurrent infection, finding a normal gallbladder, the need for additional procedures, failure to diagnose a condition, the possible need to convert to an open procedure, and creating a complication requiring transfusion or operation were discussed with the patient. The likelihood of improving the patient's symptoms with return to their baseline status is good.  The patient and/or family concurred with the proposed plan, giving informed consent. The site of surgery properly noted. The patient was taken to Operating Room, identified as Suzanne Blair and the procedure verified as Laparoscopic Cholecystectomy. A Time Out was held and the above information confirmed. Antibiotic prophylaxis was administered.   Prior to the induction of general anesthesia, antibiotic prophylaxis was administered. General endotracheal anesthesia was then administered and tolerated well. After the induction, the abdomen was prepped with Chloraprep and draped in the sterile fashion. The patient was positioned in the supine position.  Local anesthetic agent was injected into the skin near the umbilicus and an incision made. We dissected down to the abdominal fascia with blunt  dissection.  The fascia was incised vertically and we entered the peritoneal cavity bluntly.  A pursestring suture of 0-Vicryl was placed around the fascial opening.  The Hasson cannula was inserted and secured with the stay suture.  Pneumoperitoneum was then created with CO2 and tolerated well without any adverse changes in the patient's vital signs. An 5-mm port was placed in the subxiphoid position.  Two 5-mm ports were placed in the right upper quadrant. All skin incisions were infiltrated with a local anesthetic agent before making the incision and placing the trocars.   We positioned the patient in reverse Trendelenburg, tilted slightly to the patient's left.  The gallbladder was identified.  It was distended and thick-walled.  I had to aspirate the gallbladder in order to grasp it.  the fundus grasped and retracted cephalad. Adhesions were lysed bluntly and with the electrocautery where indicated, taking care not to injure any adjacent organs or viscus. The infundibulum was grasped and retracted laterally, exposing the peritoneum overlying the triangle of Calot. This was then divided and exposed in a blunt fashion. A critical view of the cystic duct and cystic artery was obtained.  The cystic duct was clearly identified and bluntly dissected circumferentially.   The cystic duct was then ligated with clips and divided. The cystic artery which had been identified & dissected free was ligated with clips and divided as well.  A posterior branch of the cystic artery was ligated as well with clips  The gallbladder was dissected from the liver bed in retrograde fashion with the electrocautery.  The posterior gallbladder was inflamed and adhered to the liver capsule causing some bleeding.  The gallbladder was removed and placed in an Ecco sac.  The gallbladder and Ecco sac were then removed through the umbilical port site. The liver bed was irrigated and inspected. Hemostasis was achieved with the  electrocautery. Copious irrigation was utilized and was repeatedly  aspirated until clear.  I did place a piece of surgical snow in the gallbladder fossa.  The pursestring suture was used to close the umbilical fascia.  I did place an additional interrupted 0 Vicryl at the umbilical fascia.  We again inspected the right upper quadrant for hemostasis.  The umbilical closure was inspected and there was no air leak and nothing trapped within the closure. Pneumoperitoneum was released as we removed the trocars.  4-0 Monocryl was used to close the skin.   Benzoin, steri-strips, and clean dressings were applied. The patient was then extubated and brought to the recovery room in stable condition. Instrument, sponge, and needle counts were correct at closure and at the conclusion of the case.   Findings: Cholecystitis with Cholelithiasis +critical view +snow Estimated Blood Loss: <25 ml         Drains: none         Specimens: Gallbladder           Complications: None; patient tolerated the procedure well.         Disposition: PACU - hemodynamically stable.         Condition: stable  Leighton Ruff. Redmond Pulling, MD, FACS General, Bariatric, & Minimally Invasive Surgery [facility], [place]

## 2019-02-26 NOTE — Anesthesia Procedure Notes (Signed)
Procedure Name: Intubation Date/Time: 02/26/2019 11:58 AM Performed by: Lance Coon, CRNA Pre-anesthesia Checklist: Patient identified, Emergency Drugs available, Suction available, Timeout performed and Patient being monitored Patient Re-evaluated:Patient Re-evaluated prior to induction Oxygen Delivery Method: Circle system utilized Preoxygenation: Pre-oxygenation with 100% oxygen Induction Type: IV induction and Rapid sequence Laryngoscope Size: Miller and 3 Grade View: Grade I Tube type: Oral Tube size: 7.0 mm Number of attempts: 1 Airway Equipment and Method: Stylet Placement Confirmation: ETT inserted through vocal cords under direct vision,  positive ETCO2 and breath sounds checked- equal and bilateral Secured at: 20 cm Tube secured with: Tape Dental Injury: Teeth and Oropharynx as per pre-operative assessment

## 2019-02-26 NOTE — ED Notes (Addendum)
ED TO INPATIENT HANDOFF REPORT  ED Nurse Name and Phone #:  Gwynn Burly Name/Age/Gender Suzanne Blair 43 y.o. female Room/Bed: 033C/033C  Code Status   Code Status: Prior  Home/SNF/Other Home Patient oriented to: self, place, time and situation Is this baseline? Yes   Triage Complete: Triage complete  Chief Complaint abd pain  Triage Note Pt here with c/o ABD pain that radiates to the back that is worth with food consumption.  Pt denies any N/V/D or fevers.     Allergies Allergies  Allergen Reactions  . Penicillins Hives and Rash    Childhood reaction Did it involve swelling of the face/tongue/throat, SOB, or low BP? No Did it involve sudden or severe rash/hives, skin peeling, or any reaction on the inside of your mouth or nose? Yes Did you need to seek medical attention at a hospital or doctor's office? Unknown When did it last happen?childhood If all above answers are "NO", may proceed with cephalosporin use.     Level of Care/Admitting Diagnosis ED Disposition    ED Disposition Condition Comment   Admit  Hospital Area: [facility] [[id number]]  Level of Care: Med-Surg [16]  Diagnosis: Acute cholecystitis [575.0.ICD-9-CM]  Admitting Physician: CCS, [place]  Attending Physician: CCS, MD [[id number]]  Bed request comments: 6N  PT Class (Do Not Modify): Observation [104]  PT Acc Code (Do Not Modify): Observation [10022]       B Medical/Surgery History Past Medical History:  Diagnosis Date  . Allergy    seasonal  . Carpal tunnel syndrome    Past Surgical History:  Procedure Laterality Date  . APPENDECTOMY    . CESAREAN SECTION  x2  . DILATION AND EVACUATION  09/05/2011   Procedure: DILATATION AND EVACUATION (D&E);  Surgeon: Clarene Duke, MD;  Location: [facility];  Service: Gynecology;  Laterality: N/A;     A IV Location/Drains/Wounds Patient Lines/Drains/Airways Status   Active Line/Drains/Airways    Name:   Placement date:    Placement time:   Site:   Days:   Peripheral IV 02/26/19 Right Antecubital   02/26/19    0854    Antecubital   less than 1   Incision 09/05/11 Perineum Other (Comment)   09/05/11    0942     2731   Incision (Closed) 03/15/14 Neck Left   03/15/14    1059     1809          Intake/Output Last 24 hours  Intake/Output Summary (Last 24 hours) at 02/26/2019 1046 Last data filed at 02/26/2019 0853 Gross per 24 hour  Intake 1000 ml  Output -  Net 1000 ml    Labs/Imaging Results for orders placed or performed during the hospital encounter of 02/26/19 (from the past 48 hour(s))  CBC with Differential     Status: None   Collection Time: 02/26/19  6:59 AM  Result Value Ref Range   WBC 7.2 4.0 - 10.5 K/uL   RBC 4.55 3.87 - 5.11 MIL/uL   Hemoglobin 13.0 12.0 - 15.0 g/dL   HCT 41.6 36.0 - 46.0 %   MCV 91.4 80.0 - 100.0 fL   MCH 28.6 26.0 - 34.0 pg   MCHC 31.3 30.0 - 36.0 g/dL   RDW 11.9 11.5 - 15.5 %   Platelets 315 150 - 400 K/uL   nRBC 0.0 0.0 - 0.2 %   Neutrophils Relative % 55 %   Neutro Abs 4.0 1.7 - 7.7 K/uL   Lymphocytes  Relative 31 %   Lymphs Abs 2.2 0.7 - 4.0 K/uL   Monocytes Relative 11 %   Monocytes Absolute 0.8 0.1 - 1.0 K/uL   Eosinophils Relative 2 %   Eosinophils Absolute 0.2 0.0 - 0.5 K/uL   Basophils Relative 1 %   Basophils Absolute 0.1 0.0 - 0.1 K/uL   Immature Granulocytes 0 %   Abs Immature Granulocytes 0.03 0.00 - 0.07 K/uL    Comment: Performed at [facility] [address], [place], [place] [address]  Comprehensive metabolic panel     Status: None   Collection Time: 02/26/19  6:59 AM  Result Value Ref Range   Sodium 138 135 - 145 mmol/L   Potassium 3.9 3.5 - 5.1 mmol/L   Chloride 104 98 - 111 mmol/L   CO2 24 22 - 32 mmol/L   Glucose, Bld 91 70 - 99 mg/dL   BUN 9 6 - 20 mg/dL   Creatinine, Ser 0.82 0.44 - 1.00 mg/dL   Calcium 9.4 8.9 - 10.3 mg/dL   Total Protein 7.1 6.5 - 8.1 g/dL   Albumin 3.7 3.5 - 5.0 g/dL   AST 16 15 - 41 U/L   ALT 17 0 - 44  U/L   Alkaline Phosphatase 70 38 - 126 U/L   Total Bilirubin 0.4 0.3 - 1.2 mg/dL   GFR calc non Af Amer >60 >60 mL/min   GFR calc Af Amer >60 >60 mL/min   Anion gap 10 5 - 15    Comment: Performed at [facility], [place] [address], [place], [place] [address]  Lipase, blood     Status: None   Collection Time: 02/26/19  6:59 AM  Result Value Ref Range   Lipase 30 11 - 51 U/L    Comment: Performed at [facility] [address], [place], [place] [address]  I-Stat Beta hCG blood, ED ([facility], [facility], [facility] only)     Status: None   Collection Time: 02/26/19  7:15 AM  Result Value Ref Range   I-stat hCG, quantitative <5.0 <5 mIU/mL   Comment 3            Comment:   GEST. AGE      CONC.  (mIU/mL)   <=1 WEEK        5 - 50     2 WEEKS       50 - 500     3 WEEKS       100 - 10,000     4 WEEKS     1,000 - 30,000        FEMALE AND NON-PREGNANT FEMALE:     LESS THAN 5 mIU/mL    US Abdomen Limited Ruq  Result Date: 02/26/2019 CLINICAL DATA:  Right upper quadrant abdominal pain for 3 days. EXAM: ULTRASOUND ABDOMEN LIMITED RIGHT UPPER QUADRANT COMPARISON:  None. FINDINGS: Gallbladder: Gallbladder is distended. There is a stone in the gallbladder neck measuring 2.4 cm. Wall is thickened to between 4 and 5 mm. There is positive tenderness over the gallbladder to transducer pressure, a positive sonographic Murphy's sign. Common bile duct: Diameter: 3 mm Liver: No focal lesion identified. Within normal limits in parenchymal echogenicity. Portal vein is patent on color Doppler imaging with normal direction of blood flow towards the liver. IMPRESSION: 1. Sonographic findings consistent with acute cholecystitis. Distended gallbladder with a 2.4 cm stone in the neck, mild gallbladder wall thickening and a positive sonographic Murphy's sign. Electronically Signed  By: Lajean Manes M.D.   On: 02/26/2019 08:09    Pending Labs Unresulted Labs (From admission, onward)    Start     Ordered   02/26/19 0720   Urinalysis, Routine w reflex microscopic  Once,   R     02/26/19 0719   Signed and Held  HIV antibody (Routine Testing)  Once,   R     Signed and Held   Signed and Held  Creatinine, serum  (enoxaparin (LOVENOX)    CrCl >/= 30 ml/min)  Weekly,   R    Comments:  while on enoxaparin therapy    Signed and Held          Vitals/Pain Today's Vitals   02/26/19 0709 02/26/19 0715 02/26/19 0730 02/26/19 0834  BP:  103/71 114/81 112/80  Pulse:  96 88 (!) 101  Resp:   12 14  Temp:      TempSrc:      SpO2:  99% 99% 100%  PainSc: 4        Isolation Precautions No active isolations  Medications Medications  morphine 4 MG/ML injection 4 mg (0 mg Intravenous Hold 02/26/19 0724)  ondansetron (ZOFRAN) injection 4 mg (0 mg Intravenous Hold 02/26/19 0724)  sodium chloride 0.9 % bolus 1,000 mL (0 mLs Intravenous Stopped 02/26/19 0853)  ciprofloxacin (CIPRO) IVPB 400 mg (0 mg Intravenous Stopped 02/26/19 1033)    Mobility walks Low fall risk   Focused Assessments         R Recommendations: See Admitting Provider Note  Report given to:   Additional Notes:   The patient appears to have early cholecystitis given she has a large gallstone lodged in the neck with of the gallbladder with some mild wall thickening and RUQ pain.  She will be admitted to undergo lap chole.  She will remain NPO.

## 2019-02-27 ENCOUNTER — Encounter (HOSPITAL_COMMUNITY): Payer: Self-pay | Admitting: General Surgery

## 2020-01-06 IMAGING — US ULTRASOUND ABDOMEN LIMITED
1 series · 14 of 25 positions shown · non-contrast
Comparison: None.

CLINICAL DATA: Right upper quadrant abdominal pain for 3 days.

EXAM:
ULTRASOUND ABDOMEN LIMITED RIGHT UPPER QUADRANT

[Series 1: ultrasound abdomen limited · 14 of 48 slices shown]
[im 1/48]
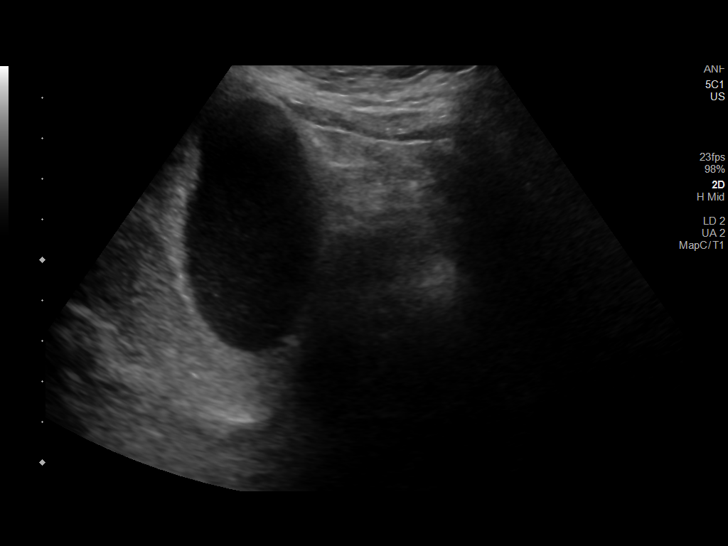
[im 4/48]
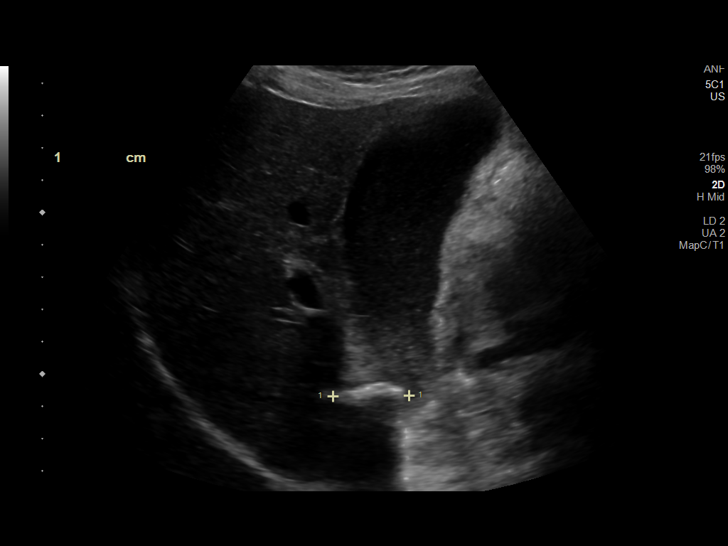
[im 8/48]
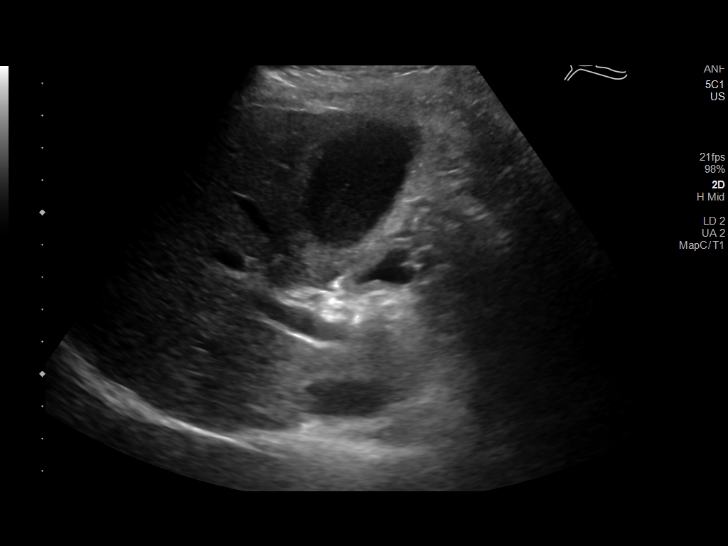
[im 12/48]
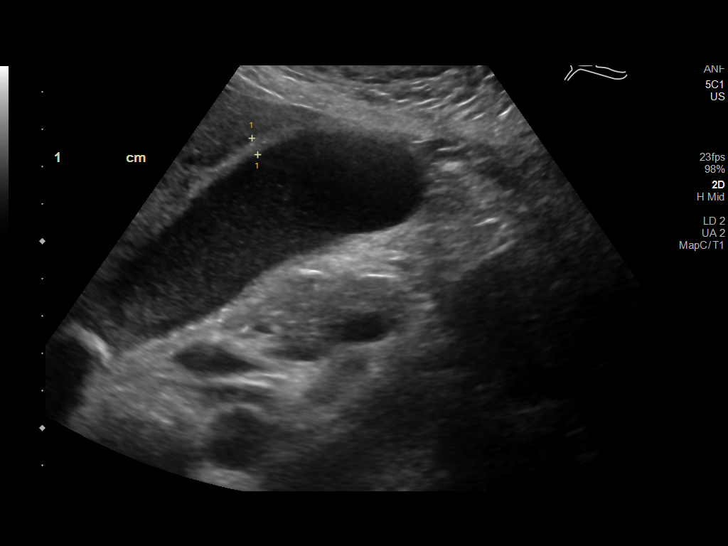
[im 16/48]
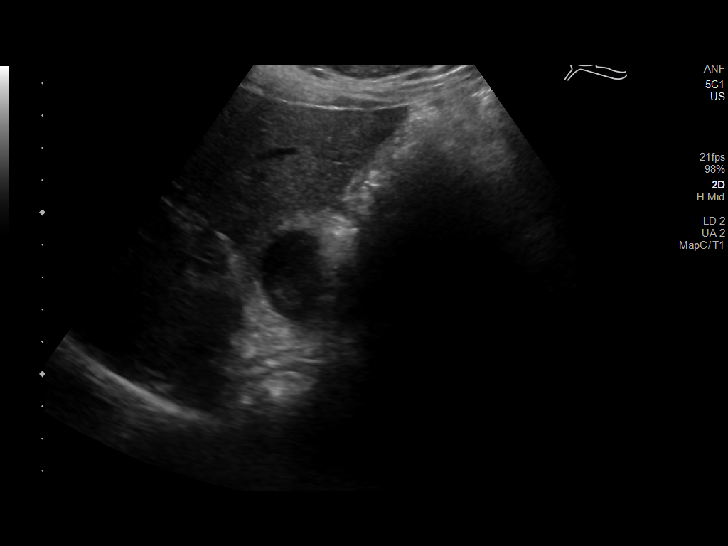
[im 18/48]
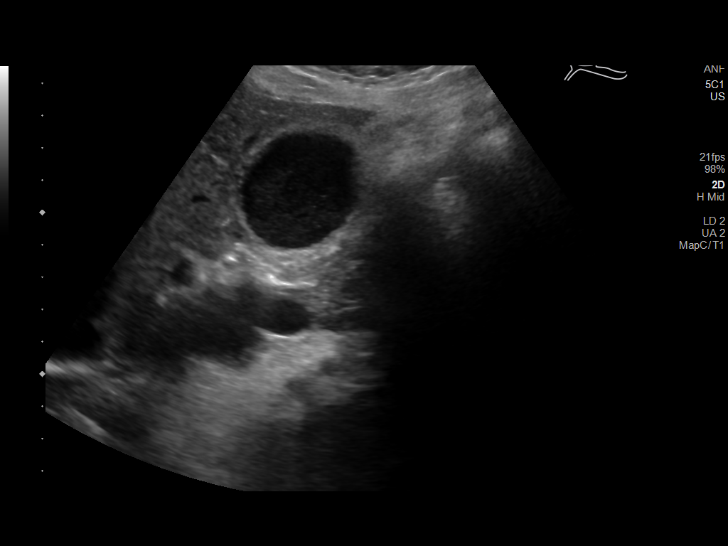
[im 22/48]
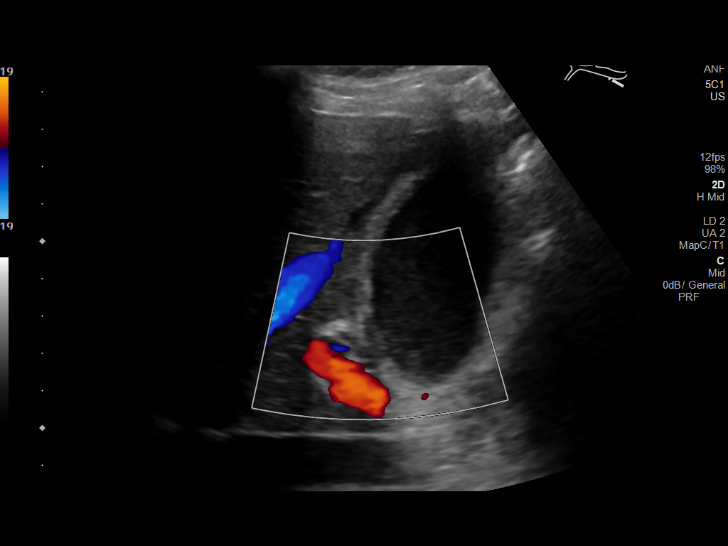
[im 26/48]
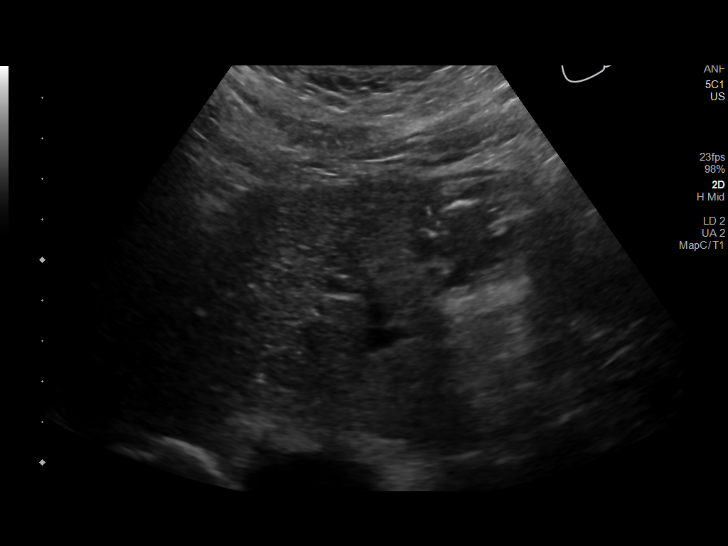
[im 30/48]
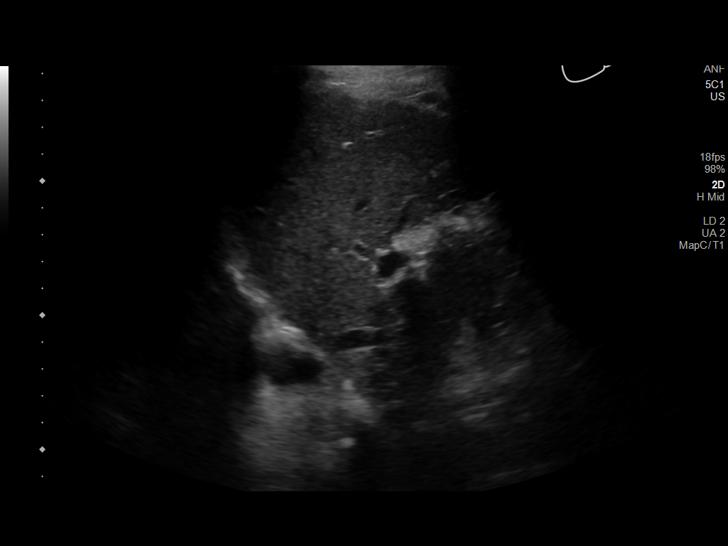
[im 32/48]
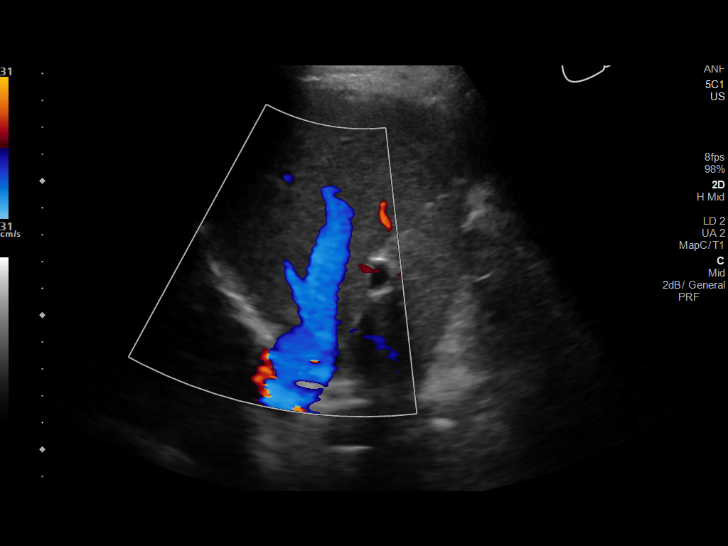
[im 36/48]
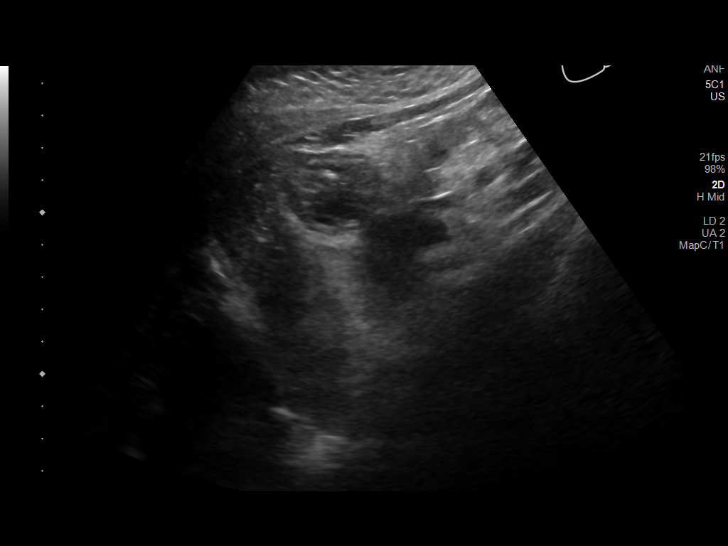
[im 40/48]
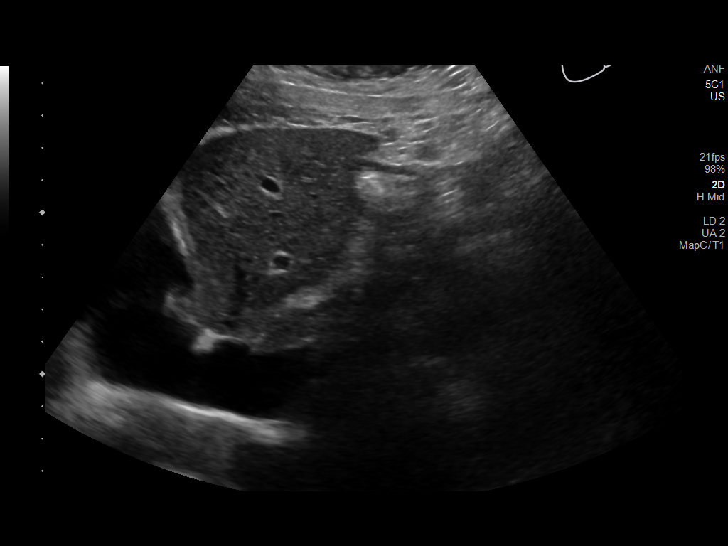
[im 44/48]
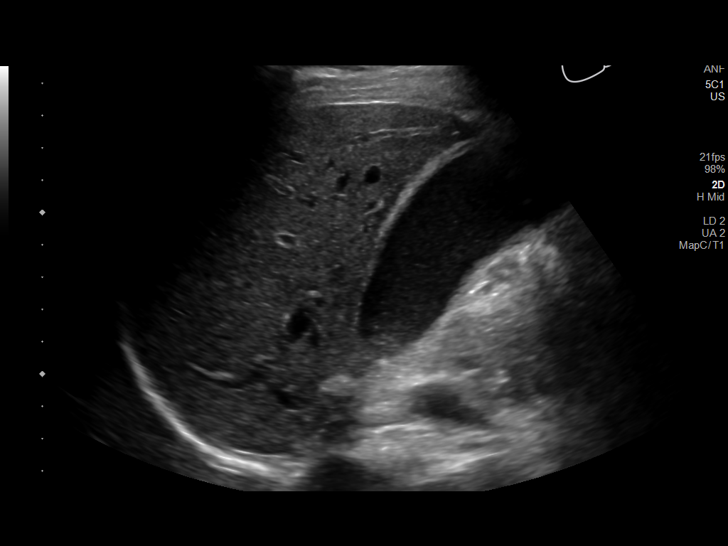
[im 48/48]
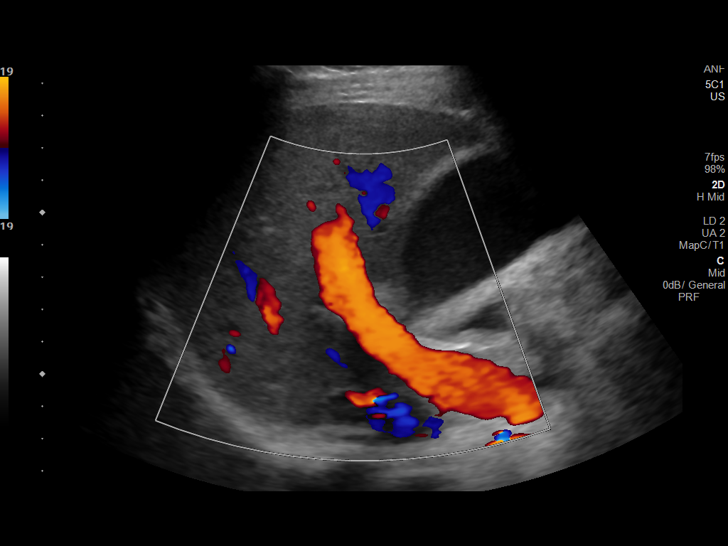

[14 of 25 positions shown; findings below may reference images not displayed]

FINDINGS: Gallbladder:

Gallbladder is distended. There is a stone in the gallbladder neck
measuring 2.4 cm. Wall is thickened to between 4 and 5 mm. There is
positive tenderness over the gallbladder to transducer pressure, a
positive sonographic Murphy's sign.

Common bile duct:

Diameter: 3 mm

Liver:

No focal lesion identified. Within normal limits in parenchymal
echogenicity. Portal vein is patent on color Doppler imaging with
normal direction of blood flow towards the liver.
IMPRESSION: 1. Sonographic findings consistent with acute cholecystitis.
Distended gallbladder with a 2.4 cm stone in the neck, mild
gallbladder wall thickening and a positive sonographic Murphy's
sign.

## 2021-05-02 ENCOUNTER — Encounter (HOSPITAL_COMMUNITY): Payer: Self-pay | Admitting: General Surgery

## 2023-05-08 ENCOUNTER — Ambulatory Visit: Payer: Managed Care, Other (non HMO) | Admitting: Family Medicine
